# Patient Record
Sex: Female | Born: 1965
Health system: Southern US, Community
[De-identification: ages and names within clinical notes are randomized; demographics above are authoritative.]

## PROBLEM LIST (undated history)

## (undated) ENCOUNTER — Ambulatory Visit (HOSPITAL_COMMUNITY): Admission: EM | Disposition: A | Discharge status: Other Acute Inpatient Hospital | Payer: 59 | Source: Ambulatory Visit

## (undated) DIAGNOSIS — I499 Cardiac arrhythmia, unspecified: Secondary | ICD-10-CM

## (undated) DIAGNOSIS — L02429 Furuncle of limb, unspecified: Secondary | ICD-10-CM

## (undated) DIAGNOSIS — M797 Fibromyalgia: Secondary | ICD-10-CM

## (undated) DIAGNOSIS — M1990 Unspecified osteoarthritis, unspecified site: Secondary | ICD-10-CM

## (undated) DIAGNOSIS — K648 Other hemorrhoids: Secondary | ICD-10-CM

## (undated) DIAGNOSIS — J45909 Unspecified asthma, uncomplicated: Secondary | ICD-10-CM

## (undated) DIAGNOSIS — W5501XA Bitten by cat, initial encounter: Secondary | ICD-10-CM

## (undated) DIAGNOSIS — D649 Anemia, unspecified: Secondary | ICD-10-CM

## (undated) DIAGNOSIS — D126 Benign neoplasm of colon, unspecified: Secondary | ICD-10-CM

## (undated) DIAGNOSIS — T7840XA Allergy, unspecified, initial encounter: Secondary | ICD-10-CM

## (undated) DIAGNOSIS — R51 Headache: Secondary | ICD-10-CM

## (undated) DIAGNOSIS — K469 Unspecified abdominal hernia without obstruction or gangrene: Secondary | ICD-10-CM

## (undated) DIAGNOSIS — IMO0002 Reserved for concepts with insufficient information to code with codable children: Secondary | ICD-10-CM

## (undated) DIAGNOSIS — C4491 Basal cell carcinoma of skin, unspecified: Secondary | ICD-10-CM

## (undated) DIAGNOSIS — I10 Essential (primary) hypertension: Secondary | ICD-10-CM

## (undated) HISTORY — DX: Other hemorrhoids: K64.8

## (undated) HISTORY — DX: Essential (primary) hypertension: I10

## (undated) HISTORY — DX: Bitten by cat, initial encounter: W55.01XA

## (undated) HISTORY — DX: Basal cell carcinoma of skin, unspecified: C44.91

## (undated) HISTORY — PX: TONSILLECTOMY: SUR1361

## (undated) HISTORY — DX: Benign neoplasm of colon, unspecified: D12.6

## (undated) HISTORY — PX: PILONIDAL CYST / SINUS EXCISION: SUR543

## (undated) HISTORY — DX: Allergy, unspecified, initial encounter: T78.40XA

## (undated) HISTORY — PX: HAND SURGERY: SHX662

## (undated) HISTORY — DX: Unspecified osteoarthritis, unspecified site: M19.90

## (undated) HISTORY — DX: Unspecified abdominal hernia without obstruction or gangrene: K46.9

## (undated) HISTORY — PX: WISDOM TOOTH EXTRACTION: SHX21

## (undated) HISTORY — DX: Reserved for concepts with insufficient information to code with codable children: IMO0002

## (undated) HISTORY — PX: BUNIONECTOMY: SHX129

## (undated) HISTORY — DX: Unspecified asthma, uncomplicated: J45.909

---

## 2004-03-20 ENCOUNTER — Other Ambulatory Visit: Admission: RE | Admit: 2004-03-20 | Discharge: 2004-03-20 | Payer: Self-pay | Admitting: Obstetrics and Gynecology

## 2005-03-21 ENCOUNTER — Other Ambulatory Visit: Admission: RE | Admit: 2005-03-21 | Discharge: 2005-03-21 | Payer: Self-pay | Admitting: Obstetrics and Gynecology

## 2005-09-17 ENCOUNTER — Ambulatory Visit: Payer: Self-pay | Admitting: Family Medicine

## 2005-09-22 ENCOUNTER — Ambulatory Visit: Payer: Self-pay | Admitting: Family Medicine

## 2005-09-23 ENCOUNTER — Ambulatory Visit: Payer: Self-pay | Admitting: Family Medicine

## 2005-09-25 ENCOUNTER — Encounter: Admission: RE | Admit: 2005-09-25 | Discharge: 2005-09-25 | Payer: Self-pay | Admitting: Family Medicine

## 2005-09-29 ENCOUNTER — Ambulatory Visit: Payer: Self-pay | Admitting: Family Medicine

## 2006-03-26 ENCOUNTER — Ambulatory Visit: Payer: Self-pay | Admitting: Gastroenterology

## 2006-03-27 ENCOUNTER — Other Ambulatory Visit: Admission: RE | Admit: 2006-03-27 | Discharge: 2006-03-27 | Payer: Self-pay | Admitting: Obstetrics and Gynecology

## 2006-04-10 ENCOUNTER — Encounter: Payer: Self-pay | Admitting: Family Medicine

## 2006-04-10 ENCOUNTER — Ambulatory Visit: Payer: Self-pay | Admitting: Gastroenterology

## 2006-06-25 ENCOUNTER — Ambulatory Visit: Payer: Self-pay | Admitting: Family Medicine

## 2006-09-24 ENCOUNTER — Ambulatory Visit: Payer: Self-pay | Admitting: Family Medicine

## 2006-09-28 ENCOUNTER — Encounter: Admission: RE | Admit: 2006-09-28 | Discharge: 2006-09-28 | Payer: Self-pay | Admitting: Obstetrics and Gynecology

## 2007-02-16 DIAGNOSIS — L02519 Cutaneous abscess of unspecified hand: Secondary | ICD-10-CM | POA: Insufficient documentation

## 2007-02-16 DIAGNOSIS — L03119 Cellulitis of unspecified part of limb: Secondary | ICD-10-CM

## 2007-02-17 ENCOUNTER — Ambulatory Visit (HOSPITAL_COMMUNITY): Admission: RE | Admit: 2007-02-17 | Discharge: 2007-02-20 | Payer: Self-pay | Admitting: Orthopedic Surgery

## 2007-02-17 ENCOUNTER — Ambulatory Visit: Payer: Self-pay | Admitting: Family Medicine

## 2007-02-19 ENCOUNTER — Ambulatory Visit: Payer: Self-pay | Admitting: Internal Medicine

## 2007-02-22 ENCOUNTER — Encounter (HOSPITAL_COMMUNITY): Admission: RE | Admit: 2007-02-22 | Discharge: 2007-05-14 | Payer: Self-pay | Admitting: Internal Medicine

## 2007-03-04 DIAGNOSIS — H8110 Benign paroxysmal vertigo, unspecified ear: Secondary | ICD-10-CM | POA: Insufficient documentation

## 2007-03-11 ENCOUNTER — Ambulatory Visit: Payer: Self-pay | Admitting: Family Medicine

## 2007-03-11 ENCOUNTER — Telehealth (INDEPENDENT_AMBULATORY_CARE_PROVIDER_SITE_OTHER): Payer: Self-pay | Admitting: *Deleted

## 2007-03-16 ENCOUNTER — Ambulatory Visit (HOSPITAL_BASED_OUTPATIENT_CLINIC_OR_DEPARTMENT_OTHER): Admission: RE | Admit: 2007-03-16 | Discharge: 2007-03-16 | Payer: Self-pay | Admitting: Orthopedic Surgery

## 2007-03-29 ENCOUNTER — Other Ambulatory Visit: Admission: RE | Admit: 2007-03-29 | Discharge: 2007-03-29 | Payer: Self-pay | Admitting: Obstetrics and Gynecology

## 2007-05-04 ENCOUNTER — Telehealth (INDEPENDENT_AMBULATORY_CARE_PROVIDER_SITE_OTHER): Payer: Self-pay | Admitting: *Deleted

## 2007-05-24 ENCOUNTER — Ambulatory Visit: Payer: Self-pay | Admitting: Family Medicine

## 2007-05-24 DIAGNOSIS — R002 Palpitations: Secondary | ICD-10-CM | POA: Insufficient documentation

## 2007-05-24 DIAGNOSIS — IMO0002 Reserved for concepts with insufficient information to code with codable children: Secondary | ICD-10-CM | POA: Insufficient documentation

## 2007-05-27 ENCOUNTER — Ambulatory Visit: Payer: Self-pay | Admitting: Family Medicine

## 2007-05-27 ENCOUNTER — Encounter: Admission: RE | Admit: 2007-05-27 | Discharge: 2007-05-27 | Payer: Self-pay | Admitting: Family Medicine

## 2007-05-27 DIAGNOSIS — I10 Essential (primary) hypertension: Secondary | ICD-10-CM | POA: Insufficient documentation

## 2007-06-02 ENCOUNTER — Telehealth: Payer: Self-pay | Admitting: Internal Medicine

## 2007-06-29 ENCOUNTER — Ambulatory Visit: Payer: Self-pay | Admitting: Family Medicine

## 2007-09-29 ENCOUNTER — Encounter: Admission: RE | Admit: 2007-09-29 | Discharge: 2007-09-29 | Payer: Self-pay | Admitting: Family Medicine

## 2008-03-29 ENCOUNTER — Encounter: Payer: Self-pay | Admitting: Obstetrics and Gynecology

## 2008-03-29 ENCOUNTER — Ambulatory Visit: Payer: Self-pay | Admitting: Obstetrics and Gynecology

## 2008-03-29 ENCOUNTER — Other Ambulatory Visit: Admission: RE | Admit: 2008-03-29 | Discharge: 2008-03-29 | Payer: Self-pay | Admitting: Obstetrics and Gynecology

## 2008-04-11 ENCOUNTER — Ambulatory Visit: Payer: Self-pay | Admitting: Obstetrics and Gynecology

## 2008-04-14 ENCOUNTER — Ambulatory Visit: Payer: Self-pay | Admitting: Oncology

## 2008-05-30 ENCOUNTER — Ambulatory Visit: Payer: Self-pay | Admitting: Oncology

## 2008-07-26 ENCOUNTER — Ambulatory Visit: Payer: Self-pay | Admitting: Oncology

## 2008-08-25 ENCOUNTER — Ambulatory Visit: Payer: Self-pay | Admitting: Family Medicine

## 2008-10-17 ENCOUNTER — Encounter: Admission: RE | Admit: 2008-10-17 | Discharge: 2008-10-17 | Payer: Self-pay | Admitting: Obstetrics and Gynecology

## 2009-03-30 ENCOUNTER — Ambulatory Visit: Payer: Self-pay | Admitting: Obstetrics and Gynecology

## 2009-03-30 ENCOUNTER — Other Ambulatory Visit: Admission: RE | Admit: 2009-03-30 | Discharge: 2009-03-30 | Payer: Self-pay | Admitting: Obstetrics and Gynecology

## 2009-05-03 ENCOUNTER — Ambulatory Visit: Payer: Self-pay | Admitting: Obstetrics and Gynecology

## 2009-05-31 ENCOUNTER — Ambulatory Visit: Payer: Self-pay | Admitting: Obstetrics and Gynecology

## 2009-10-19 ENCOUNTER — Ambulatory Visit: Payer: Self-pay | Admitting: Family Medicine

## 2009-10-19 LAB — CONVERTED CEMR LAB
ALT: 18 units/L (ref 0–35)
Alkaline Phosphatase: 110 units/L (ref 39–117)
BUN: 22 mg/dL (ref 6–23)
Basophils Relative: 0.5 % (ref 0.0–3.0)
CO2: 33 meq/L — ABNORMAL HIGH (ref 19–32)
Chloride: 101 meq/L (ref 96–112)
Eosinophils Absolute: 0.4 10*3/uL (ref 0.0–0.7)
HCT: 39.2 % (ref 36.0–46.0)
HDL: 52.3 mg/dL (ref >39.00)
Leukocytes, UA: NEGATIVE
Lymphocytes Relative: 27.6 % (ref 12.0–46.0)
Lymphs Abs: 3.2 10*3/uL (ref 0.7–4.0)
MCHC: 33.4 g/dL (ref 30.0–36.0)
Monocytes Relative: 6.8 % (ref 3.0–12.0)
Nitrite: NEGATIVE
Platelets: 383 10*3/uL (ref 150.0–400.0)
Potassium: 4.6 meq/L (ref 3.5–5.1)
RBC: 4.88 M/uL (ref 3.87–5.11)
RDW: 14.5 % (ref 11.5–14.6)
Sodium: 142 meq/L (ref 135–145)
TSH: 1.35 microintl units/mL (ref 0.35–5.50)
Total Bilirubin: 0.5 mg/dL (ref 0.3–1.2)
Total CHOL/HDL Ratio: 4
Triglycerides: 118 mg/dL (ref 0.0–149.0)
VLDL: 23.6 mg/dL (ref 0.0–40.0)
pH: 8 (ref 5.0–8.0)

## 2009-10-22 ENCOUNTER — Encounter: Admission: RE | Admit: 2009-10-22 | Discharge: 2009-10-22 | Payer: Self-pay | Admitting: Obstetrics and Gynecology

## 2009-11-23 ENCOUNTER — Ambulatory Visit: Payer: Self-pay | Admitting: Family Medicine

## 2009-11-23 DIAGNOSIS — E669 Obesity, unspecified: Secondary | ICD-10-CM | POA: Insufficient documentation

## 2010-04-01 ENCOUNTER — Other Ambulatory Visit: Admission: RE | Admit: 2010-04-01 | Discharge: 2010-04-01 | Payer: Self-pay | Admitting: Obstetrics and Gynecology

## 2010-04-01 ENCOUNTER — Ambulatory Visit: Payer: Self-pay | Admitting: Obstetrics and Gynecology

## 2010-06-09 LAB — CONVERTED CEMR LAB
ALT: 13 units/L (ref 0–35)
AST: 19 units/L (ref 0–37)
Albumin: 3.7 g/dL (ref 3.5–5.2)
Basophils Absolute: 0.1 10*3/uL (ref 0.0–0.1)
CO2: 25 meq/L (ref 19–32)
Eosinophils Absolute: 0.3 10*3/uL (ref 0.0–0.6)
GFR calc Af Amer: 88 mL/min
Ketones, urine, test strip: NEGATIVE
MCHC: 33.6 g/dL (ref 30.0–36.0)
MCV: 74.5 fL — ABNORMAL LOW (ref 78.0–100.0)
Monocytes Absolute: 0.5 10*3/uL (ref 0.2–0.7)
Monocytes Relative: 3.7 % (ref 3.0–11.0)
Neutro Abs: 9 10*3/uL — ABNORMAL HIGH (ref 1.4–7.7)
Neutrophils Relative %: 68.3 % (ref 43.0–77.0)
Nitrite: NEGATIVE
Platelets: 453 10*3/uL — ABNORMAL HIGH (ref 150–400)
Protein, U semiquant: NEGATIVE
RBC: 4.74 M/uL (ref 3.87–5.11)
RDW: 14.8 % — ABNORMAL HIGH (ref 11.5–14.6)
Specific Gravity, Urine: 1.02
TSH: 0.88 microintl units/mL (ref 0.35–5.50)
Total Bilirubin: 0.4 mg/dL (ref 0.3–1.2)
Total Protein: 6.7 g/dL (ref 6.0–8.3)
WBC Urine, dipstick: NEGATIVE
WBC: 13.2 10*3/uL — ABNORMAL HIGH (ref 4.5–10.5)
pH: 5.5

## 2010-06-11 NOTE — Procedures (Signed)
Summary: Colonoscopy/[facility]  Colonoscopy/[facility]   Imported By: Sherian Rein 10/29/2009 15:14:57  _____________________________________________________________________  External Attachment:    Type:   Image     Comment:   External Document

## 2010-06-11 NOTE — Assessment & Plan Note (Signed)
Summary: cpx/cjr/PT RESCD//CCM   Vital Signs:  Patient profile:   45 year old female Height:      68.25 inches Weight:      239 pounds BMI:     36.20 Temp:     98.7 degrees F oral BP sitting:   134 / 94  (left arm) Cuff size:   regular  Vitals Entered By: Kern Reap CMA Duncan Dull) (November 23, 2009 4:04 PM) CC: cpx Is Patient Diabetic? No   CC:  cpx.  History of Present Illness: April [name] is a 45 year old female, married, nonsmoker, with underlying hypertension, and obesity.  Has gained 18 pounds in the last 12 months who comes in today for physical examination  BP at home 134/94.  Same here asked her to check her BP daily for 3 weeks.  If diastolic continues to stay above 85 to contact us and we will change her medication.  Her weight is up to 239...Marland KitchenMarland KitchenMarland Kitchen recommend nutrition consult.  She also takes Claritin 10 mg daily for allergic rhinitis.  By GYN.  She Does Get Annual Mammography Is Not Due BSE Monthly.  She Continues to Have Vertigo.  She's Been to See Dr. Narda Bonds.  ENT and Has Had the Epley Maneuver, However, She Still Has Intermittent Daily, Vertigo.  Advised She Call Dr. Ezzard Standing and to Consult at [facility]  She recently had a total body skin exam by her dermatologist.  She has slight skin line.  Eyes get a lot of sun exposure and also used to go to tanning booth  Allergies: 1)  ! Amoxicillin 2)  ! Pcn  Past History:  Past medical, surgical, family and social histories (including risk factors) reviewed, and no changes noted (except as noted below).  Past Medical History: Reviewed history from 05/27/2007 and no changes required. cat bite with secondary infection right hand hospitalized October 2008 for surgery Hypertension  Family History: Reviewed history and no changes required.  Social History: Reviewed history and no changes required.  Review of Systems      See HPI  Physical Exam  General:  Well-developed,well-nourished,in no acute distress;  alert,appropriate and cooperative throughout examination Head:  Normocephalic and atraumatic without obvious abnormalities. No apparent alopecia or balding. Eyes:  No corneal or conjunctival inflammation noted. EOMI. Perrla. Funduscopic exam benign, without hemorrhages, exudates or papilledema. Vision grossly normal. Ears:  External ear exam shows no significant lesions or deformities.  Otoscopic examination reveals clear canals, tympanic membranes are intact bilaterally without bulging, retraction, inflammation or discharge. Hearing is grossly normal bilaterally. Nose:  External nasal examination shows no deformity or inflammation. Nasal mucosa are pink and moist without lesions or exudates. Mouth:  Oral mucosa and oropharynx without lesions or exudates.  Teeth in good repair. Neck:  No deformities, masses, or tenderness noted. Chest Wall:  No deformities, masses, or tenderness noted. Breasts:  No mass, nodules, thickening, tenderness, bulging, retraction, inflamation, nipple discharge or skin changes noted.   Lungs:  Normal respiratory effort, chest expands symmetrically. Lungs are clear to auscultation, no crackles or wheezes. Heart:  Normal rate and regular rhythm. S1 and S2 normal without gallop, murmur, click, rub or other extra sounds. Abdomen:  Bowel sounds positive,abdomen soft and non-tender without masses, organomegaly or hernias noted. Msk:  No deformity or scoliosis noted of thoracic or lumbar spine.   Pulses:  R and L carotid,radial,femoral,dorsalis pedis and posterior tibial pulses are full and equal bilaterally Extremities:  No clubbing, cyanosis, edema, or deformity noted with normal full range of motion  of all joints.   Neurologic:  No cranial nerve deficits noted. Station and gait are normal. Plantar reflexes are down-going bilaterally. DTRs are symmetrical throughout. Sensory, motor and coordinative functions appear intact. Skin:  Intact without suspicious lesions or  rashes Cervical Nodes:  No lymphadenopathy noted Axillary Nodes:  No palpable lymphadenopathy Inguinal Nodes:  No significant adenopathy Psych:  Cognition and judgment appear intact. Alert and cooperative with normal attention span and concentration. No apparent delusions, illusions, hallucinations   Problems:  Medical Problems Added: 1)  Dx of Obesity  (ICD-278.00)  Impression & Recommendations:  Problem # 1:  HYPERTENSION (ICD-401.9) Assessment Improved  Her updated medication list for this problem includes:    Tenoretic 50 50-25 Mg Tabs (Atenolol-chlorthalidone) .Marland Kitchen... Take 1 tablet by mouth every morning  Orders: Prescription Created Electronically [phone]) EKG w/ Interpretation (93000)  Problem # 2:  VERTIGO, BENIGN PAROXYSMAL POSITION (ICD-386.11) Assessment: Unchanged  Her updated medication list for this problem includes:    Claritin 10 Mg Tabs (Loratadine) ..... Otc  Orders: Prescription Created Electronically [phone])  Problem # 3:  OBESITY (ICD-278.00)  Orders: Nutrition Referral (Nutrition) Prescription Created Electronically [phone])  Complete Medication List: 1)  Claritin 10 Mg Tabs (Loratadine) .... Otc 2)  Tenoretic 50 50-25 Mg Tabs (Atenolol-chlorthalidone) .... Take 1 tablet by mouth every morning  Patient Instructions: 1)  continue current blood pressure medication check a daily blood pressure in the morning for 3 weeks.  If your diastolic is averaging above 85.  Call. 2)  Let's consider a nutrition consult for weight loss 3)  Schedule your mammogram. 4)  Take an Aspirin every day. 5)  Please schedule a follow-up appointment in 1 year. Prescriptions: TENORETIC 50 50-25 MG  TABS (ATENOLOL-CHLORTHALIDONE) Take 1 tablet by mouth every morning  #100 x 3   Entered and Authorized by:   Roderick Pee MD   Signed by:   Roderick Pee MD on 11/23/2009   Method used:   Electronically to        [facility]  [phone]* (retail)       [address]  [place], [place]  [address]       Ph: [phone] or [phone]       Fax: [phone]   RxID:   [id number]    Immunization History:  Tetanus/Td Immunization History:    Tetanus/Td:  historical (02/10/2007)  Influenza Immunization History:    Influenza:  historical (02/09/2009)

## 2010-08-06 ENCOUNTER — Ambulatory Visit (INDEPENDENT_AMBULATORY_CARE_PROVIDER_SITE_OTHER): Payer: 59 | Admitting: Family Medicine

## 2010-08-06 ENCOUNTER — Encounter: Payer: Self-pay | Admitting: Family Medicine

## 2010-08-06 DIAGNOSIS — I10 Essential (primary) hypertension: Secondary | ICD-10-CM

## 2010-08-06 DIAGNOSIS — E669 Obesity, unspecified: Secondary | ICD-10-CM

## 2010-08-06 DIAGNOSIS — E785 Hyperlipidemia, unspecified: Secondary | ICD-10-CM

## 2010-08-06 LAB — HEPATIC FUNCTION PANEL
ALT: 17 U/L (ref 0–35)
AST: 21 U/L (ref 0–37)
Bilirubin, Direct: 0.1 mg/dL (ref 0.0–0.3)
Total Bilirubin: 0.5 mg/dL (ref 0.3–1.2)

## 2010-08-06 LAB — CBC WITH DIFFERENTIAL/PLATELET
Basophils Absolute: 0 10*3/uL (ref 0.0–0.1)
Basophils Relative: 0.3 % (ref 0.0–3.0)
Eosinophils Absolute: 0.3 10*3/uL (ref 0.0–0.7)
HCT: 37.3 % (ref 36.0–46.0)
Hemoglobin: 12.6 g/dL (ref 12.0–15.0)
Lymphocytes Relative: 26 % (ref 12.0–46.0)
Lymphs Abs: 3 10*3/uL (ref 0.7–4.0)
MCHC: 33.6 g/dL (ref 30.0–36.0)
MCV: 76.5 fl — ABNORMAL LOW (ref 78.0–100.0)
Monocytes Absolute: 1 10*3/uL (ref 0.1–1.0)
Neutro Abs: 7.4 10*3/uL (ref 1.4–7.7)
Neutrophils Relative %: 63.4 % (ref 43.0–77.0)
RDW: 15.1 % — ABNORMAL HIGH (ref 11.5–14.6)

## 2010-08-06 LAB — BASIC METABOLIC PANEL: Calcium: 9.7 mg/dL (ref 8.4–10.5)

## 2010-08-06 LAB — LDL CHOLESTEROL, DIRECT: Direct LDL: 174.6 mg/dL

## 2010-08-06 LAB — POCT URINALYSIS DIPSTICK
Glucose, UA: NEGATIVE
Nitrite, UA: NEGATIVE
Protein, UA: NEGATIVE

## 2010-08-06 LAB — LIPID PANEL
HDL: 54.8 mg/dL (ref >39.00)
Total CHOL/HDL Ratio: 5
Triglycerides: 86 mg/dL (ref 0.0–149.0)
VLDL: 17.2 mg/dL (ref 0.0–40.0)

## 2010-08-06 LAB — HEMOGLOBIN A1C: Hgb A1c MFr Bld: 6.1 % (ref 4.6–6.5)

## 2010-08-06 MED ORDER — ATENOLOL-CHLORTHALIDONE 50-25 MG PO TABS: 1.0000 | ORAL_TABLET | Freq: Every day | ORAL | Status: DC

## 2010-08-06 NOTE — Progress Notes (Signed)
  Subjective:    Patient ID: April [name], female    DOB: 26-Jul-1965, 45 y.o.   MRN: [id number]  HPI April [name] is a 46 year old female, nonsmoker, who comes in today for general physical examination because of a history of hypertension.  She takes Tenoretic 50 -- 25 daily.  BP 120/84.  She brings in blood pressure readings for last Friday when she had an argument at work.  Her blood pressure one 270 over hundred.  The next day went back to normal.  She gets routine eye care, dental care, BSE monthly, annual mammography, Pap by GYN, vaccinations up-to-date.     Review of Systems  Constitutional: Negative.   HENT: Negative.   Eyes: Negative.   Respiratory: Negative.   Cardiovascular: Negative.   Gastrointestinal: Negative.   Genitourinary: Negative.   Musculoskeletal: Negative.   Neurological: Negative.   Hematological: Negative.   Psychiatric/Behavioral: Negative.        Objective:   Physical Exam  Constitutional: She appears well-developed and well-nourished.  HENT:  Head: Normocephalic and atraumatic.  Right Ear: External ear normal.  Left Ear: External ear normal.  Nose: Nose normal.  Mouth/Throat: Oropharynx is clear and moist.  Eyes: EOM are normal. Pupils are equal, round, and reactive to light.  Neck: Normal range of motion. Neck supple. No thyromegaly present.  Cardiovascular: Normal rate, regular rhythm, normal heart sounds and intact distal pulses.  Exam reveals no gallop and no friction rub.   No murmur heard. Pulmonary/Chest: Effort normal and breath sounds normal.  Abdominal: Soft. Bowel sounds are normal. She exhibits no distension and no mass. There is no tenderness. There is no rebound.  Genitourinary:       Bilateral breast exam normal  Musculoskeletal: Normal range of motion.  Lymphadenopathy:    She has no cervical adenopathy.  Neurological: She is alert. She has normal reflexes. No cranial nerve deficit. She exhibits normal muscle tone. Coordination  normal.  Skin: Skin is warm and dry.       Total body skin exam normal.  She has a garden variety of freckles and moles and skin tags.  She does have a small 1 mm mole on her right labia.  Also 3 venous lake type lesions on her left labia also a venous lake.  Lesion on her left fourth toe.  Psychiatric: She has a normal mood and affect. Her behavior is normal. Judgment and thought content normal.          Assessment & Plan:  Hypertension,,,,,,,,,, continue current medications.  Overweight,,,,,,,,, diet exercise and weight loss

## 2010-08-06 NOTE — Patient Instructions (Signed)
Continue your current medications.  If you have a situation where your blood pressure is very high and take a half a Tenormin and lie down for a couple hours.  Begin a walking program 30 minutes daily.  Return in one year or sooner if any problems

## 2010-09-24 NOTE — Op Note (Signed)
NAMEJODIANN, April [name]                  ACCOUNT NO.:  [id number]   MEDICAL RECORD NO.:  [id number]          PATIENT TYPE:  OIB   LOCATION:  2550                         FACILITY:  [facility]   PHYSICIAN:  Katy Fitch. Sypher, M.D. DATE OF BIRTH:  [birth year]   DATE OF PROCEDURE:  02/17/2007  DATE OF DISCHARGE:                               OPERATIVE REPORT   PREOPERATIVE DIAGNOSES:  1. Probable right long finger metacarpophalangeal joint septic      arthritis and severe cellulitis with development of subcutaneous      abscess, dorsal aspect of right hand, following complex bite and      scratch injury to right hand sustained February 15, 2007.  2. Early septicemia signs.   POSTOPERATIVE DIAGNOSES:  1. Septic arthritis of right long finger metacarpophalangeal joint      treated with irrigation, debridement and synovectomy and placement      of Iodoform packing, right long finger metacarpophalangeal joint.  2. Complex subcutaneous abscess due to multiple cat bites and      scratches, dorsal aspect of right hand, with abscess extending from      metacarpophalangeal joints to wrist extension creases.  3. Rule out septic flexor tenosynovitis.   OPERATION:  1. Incision and drainage of multiple cat bite abscesses.  2. Incision and drainage of right long finger metacarpophalangeal      joint with sterile saline irrigation and placement of Iodoform      packing.  3. Incision and drainage of dorsal aspect of right hand with placement      of four vessel loop drain closed loops.  4. Incision and inspection of flexor sheath to rule out flexor sheath      sepsis and palmar/collar button abscess formation.   OPERATING SURGEON:  Katy Fitch. Sypher, M.D.  No assistant.   ANESTHESIA:  General endotracheal, supervising anesthesiologist is  Janetta Hora. Gelene Mink, M.D.   INDICATIONS:  April [name] is a 45 year old woman referred by Dr. Alonza Smoker for evaluation of a severe cat bite sepsis and cellulitis.   On  February 15, 2007, April [name] was interacting with feral cat, who  subsequently attacked her hand, biting and scratching her.  She  sustained full-thickness skin wounds over the dorsal aspect of her right  long finger metacarpophalangeal joint and multiple bites and scratches  over the dorsum of her hand and wrist.   She was seen at an urgent care facility, where her wound was cleaned and  SUTURED.  She subsequent was placed on azithromycin.   She became ill and subsequently presented for evaluation at Dr. Nelida Meuse  office on the morning of February 17, 2007.  At that time Dr. Tawanna Cooler noted  that she was febrile, sick-appearing, with a markedly swollen hand.   Dr. Tawanna Cooler contacted me at our office requesting an urgent hand surgery  consultation.   I emphatically informed Dr. Tawanna Cooler that in order to care for April [name],  she would need to be n.p.o., meaning no fluid or drink, from this moment  forward of our conversation.  She was advised to transport  herself to  our office on an urgent basis so that we may assess her wounds and make  arrangement for immediate outpatient surgery with probable admission for  IV antibiotics.   After emphatically stating these issues to Dr. Tawanna Cooler, we subsequently  waited for nearly 1-1/2 hours for April [name] to appear at our office.  April [name] she ultimately appeared, we questioned her whether not she had had  any food or drink.  She stated that she stopped at [employer] and had a  full breakfast including chicken nuggets and extensive liquids.   She stated that, No one told her not to eat.   We subsequently contacted the [facility] day surgery center in the hospital.  The day surgery center could not provide anesthesia services with a full  stomach; therefore, despite the fact we had reserved an operating room  for urgent care, we needed to transfer the venue for her care to [facility].   April [name] was assessed at the office and immediately transferred to the  short-stay  unit at [facility], where an IV was started for hydration  and her vital signs were examined.   She was noted to have a temperature at admission of 101.7, a pulse of  99, respiratory rate of 18 and blood pressure of 143/91.  Her laboratory  studies revealed a white count of 8400, a hemoglobin of 12 and a  platelet count 345,000.  Her electrolytes were normal.  Her glucose was  elevated 114, random.   We placed her on the OR schedule for urgent care.  We were advised that  we were fifth on the add-on list for urgent surgery.   During the ensuing 6 hours she was noted to have a temperature elevation  of as high as 102.6 and became increasingly toxic.   We continued to inquire at the OR and even began the process of  attempting to bump another surgeon; however, ultimately we were able to  access the operating room at approximately 8 p.m.   She is brought to the operating room at this time anticipating incision  and drainage.   Preoperatively, I had a lengthy informed consent with April [name] and her  mother.  All of the issues of her n.p.o. status, access to the OR, and  our need for urgent incision and drainage were explored detail.  Questions were invited and answered.   PROCEDURE:  April [name] was brought to the operating room and placed in  supine position upon the operating table.   Following an anesthesia consult by Dr. Gelene Mink, general anesthesia by  endotracheal technique was recommended.   She was brought to room #1, placed in supine position on the operating  table, and under Dr. Thornton Dales direct supervision, general  endotracheal anesthesia induced with rapid-sequence technique.   A tourniquet was applied to the proximal right brachium.  The right arm  was then prepped with Betadine soap and solution and sterilely draped.  After 2 minutes of elevation, the arterial tourniquet was inflated to  250 mmHg on the proximal brachium.   The procedure commenced with  excisional biopsy of the skin margins  around all of the traumatic bite wounds.  Granulation tissue was  forming.   The subcutaneous tissues were probed and dishwater-type seropurulent  material was recovered.  Aerobic and anaerobic cultures were obtained  from the subcutaneous abscess, followed by entry into the long finger  metacarpophalangeal joint by resecting the capsule between the extensor  tendon and  the ulnar collateral ligament.  Blunt hemostats were used to  probe the joint.  Seropurulent fluid was recovered from the joint, which  was again cultured for aerobic and anaerobic growth and samples of the  synovium were biopsied for aerobic and anaerobic growth.   The subcutaneous area was palpated and all undermined areas were opened.  A total of four incisions were made on the dorsum of the hand with  placement of looped vessel-loop sutures to promote drainage.   The wounds were subsequently carefully irrigated with a blunt dental  needle, including the radial and ulnar gutters of the  metacarpophalangeal joint.   Attention was then directed to the palm.   Due to the presence of a positive Kanavel pain with extension and pain  on palpation over the flexor sheath sign, a small incision was fashioned  in the distal palmar crease allowing exposure of the flexor sheath  proximal to the A1 pulley.   The synovium was incised and the finger milked.   A small amount of fluid was recovered.  However, no purulence was noted.  I could not identify any evidence of a collar button abscess from the  dorsum.   The dorsal wound was then packed with iodoform into the capsule of the  MP joint to promote egress of a purulent effusion.  The vessel loop  drains was secured and Adaptic applied to the wounds.  A voluminous  gauze dressing was applied with hand in a safe position.  Ace wrap was  used to provide compression.   There were no apparent complications.  The tourniquet released for  a  total tourniquet time of 26 minutes.  Ms. Alonge was awakened from  general anesthesia and transferred to the recovery room with stable  vital signs.   Preoperatively, due to our concern about sepsis she was provided 600 mg  of clindamycin IV and will continue on this medication q.6h. after an  infectious disease consult was obtained due to her drug allergies.  We  also will add Cipro 400 mg IV after the cultures were obtained and will  continue Cipro 500 mg p.o. b.i.d.   We will admit her for 24-48 hours of IV antibiotic therapy and once our  culture results are known, we will continue with oral and/or IV  antibiotic therapy at home.      Katy Fitch Sypher, M.D.  Electronically Signed     RVS/MEDQ  D:  02/17/2007  T:  02/18/2007  Job:  [id number]   cc:   Tinnie Gens A. Tawanna Cooler, MD

## 2010-09-24 NOTE — Op Note (Signed)
NAMEALYSEN, SMYLIE                  ACCOUNT NO.:  [id number]   MEDICAL RECORD NO.:  [id number]          PATIENT TYPE:  AMB   LOCATION:  DSC                          FACILITY:  [facility]   PHYSICIAN:  Katy Fitch. Sypher, M.D. DATE OF BIRTH:  1965/07/13   DATE OF PROCEDURE:  03/16/2007  DATE OF DISCHARGE:  03/16/2007                               OPERATIVE REPORT   PREOPERATIVE DIAGNOSIS:  Status post complex cat bite sepsis with septic  arthritis of the right long finger metacarpal phalangeal joint status  post incision and drainage, intravenous antibiotic therapy, and  prolonged oral antibiotic therapy with persistent granulation tissue at  wound, rule out residual sepsis, rule out significant capsulitis of the  long finger MP joint.   POSTOPERATIVE DIAGNOSIS:  Significant scarring of extensor mechanism,  right long finger metacarpal phalangeal joint, and identification of a  small area of granulation tissue on the dorsal neck of the long finger  metacarpal which could represent an area of a nidus of persistent  infection.   OPERATION:  1. Second look right long finger metacarpal phalangeal joint wound      with aerobic and anaerobic culture of granulation tissue and      peritendinous fibrosis.  2. Curettage of metacarpal neck.  3. Tenolysis of extensor mechanism with intrinsic muscle      stretch/manipulation and gentle manipulation metacarpal phalangeal      joint recovering 0 to 90 degrees of motion.   SURGEON:  Katy Fitch. Sypher, M.D.   ASSISTANT:  Marveen Reeks. Dasnoit, P.A.-C.   ANESTHESIA:  2% lidocaine field block and metacarpal phalangeal joint  block supplemented by IV sedation.   SUPERVISING ANESTHESIOLOGIST:  Guadalupe Maple, M.D.   INDICATIONS:  Island Dohmen is a 45 year old woman who is now in the fifth  week following a complex cat bite/septic arthritis/cellulitis wound  predicament involving the right hand.  Ms. Docter had been feeding feral  cats and became involved in  an altercation between one of the animals  sustaining deep bite wounds to the right hand.  She was initially  treated at an outpatient center with oral antibiotic therapy and wound  management.  She subsequently developed a profound cellulitis and septic  arthritis of the long finger metacarpal phalangeal joint requiring  admission to [facility] for incision and drainage of her right  long finger metacarpal phalangeal joint, drainage of the dorsal aspect  of her hand, and IV antibiotic therapy.  Due to an inability for animal  control to capture the animal, she is also undergoing rabies vaccine  prophylaxis.   We have allowed her metacarpal phalangeal joint and metacarpal wound to  remain open out of concern that she is unable to use penicillins and  cephalosporins which are the preferred antibiotics for a Pasteurella  infection.  While her cultures grew pure Pasteurella multacida, she has  been using second line drugs in the form of Cipro and clindamycin to  treat this predicament.  Her wound has been a bit slow in closing,  therefore, we recommend proceeding with a reculture at this  time, a  second look at her wound, and tenolysis of her extensor mechanism in an  effort to facilitate wound closure and recovery of range of motion and  to assure that her infection has resolved.  After informed consent, she  is brought to the operating room at this time.   PROCEDURE:  Lisett Dirusso is brought to the operating room and placed in  the supine position on the operating table.  Following IV sedation, 2%  lidocaine was infiltrated at the level of the metacarpal diaphysis and  surrounding metacarpal phalangeal joint to obtain a field block.  The  right arm was then prepped with Betadine soap solution and sterilely  draped.  A pneumatic tourniquet was applied to the proximal right  brachium.  Following exsanguination of the right arm with an Esmarch  bandage, the arterial tourniquet was  inflated to 250 mmHg.  The  procedure commenced with examination of her anesthesia level.  She  appeared to have excellent anesthesia around the MP joint.   An excisional debridement of the wound was accomplished with excision of  all of the prior surgical scar.  The skin was apportioned, half to  aerobic and half to anaerobic culture, followed by use of a micro-  rongeur and curette to remove granulation tissue beneath the surface of  the wound.  These specimens were placed in aerobic and anaerobic tubes  of culture media and sent to the lab for routine aerobic and anaerobic  culture with sensitivities.   The extensor mechanism was then meticulously tenolysed with the aid of a  rongeur to remove granulation tissue, a Therapist, nutritional, and scissors  dissection.  The MP joint was entered.  I noted an erosion on the dorsal  aspect of the metacarpal neck that had some granulation tissue present.  This was thoroughly curetted to a stable bone margin with a  microcurette.  This is of some concern and may represent an area of  persistent granulation tissue/infection.  Cultures will be revealing  whether or not an infection persists.   The wound was thoroughly irrigated followed by gentle tenolysis of the  capsule and manipulation of the MP joint to hyperextension 5 degrees and  further flexion 90 degrees.  Intrinsic stretching noted that with full  MP extension, 95 degrees of MP flexion was allowed.  Thereafter, the  wound was irrigated followed by placement of an intradermal 3-0 Prolene  suture that was not formally tied but rather held in place by Steri-  Strips to allow a delayed primary closure at a later date once her  culture results have been received.   Ms. Oconnor will start her Cipro 500 mg p.o. b.i.d. immediately.  She will  return to the office for follow up in 24 hours for dressing change as it  is likely she will have some drainage from this wound.  There were no  apparent  complications.  Ms. Kimery tolerated the surgery and anesthesia  well.  She was awakened from sedation, the drapes were removed, and she  was transferred back to the recovery room with stable vital signs.      Katy Fitch Sypher, M.D.  Electronically Signed     RVS/MEDQ  D:  03/16/2007  T:  03/16/2007  Job:  [id number]

## 2010-12-18 ENCOUNTER — Other Ambulatory Visit: Payer: Self-pay | Admitting: Obstetrics and Gynecology

## 2010-12-18 DIAGNOSIS — Z1231 Encounter for screening mammogram for malignant neoplasm of breast: Secondary | ICD-10-CM

## 2010-12-23 ENCOUNTER — Ambulatory Visit
Admission: RE | Admit: 2010-12-23 | Discharge: 2010-12-23 | Disposition: A | Discharge status: Home / Self Care | Payer: 59 | Source: Ambulatory Visit | Attending: Obstetrics and Gynecology | Admitting: Obstetrics and Gynecology

## 2010-12-23 DIAGNOSIS — Z1231 Encounter for screening mammogram for malignant neoplasm of breast: Secondary | ICD-10-CM

## 2011-02-18 LAB — ANAEROBIC CULTURE

## 2011-02-18 LAB — TISSUE CULTURE: Culture: NO GROWTH

## 2011-02-20 LAB — ANAEROBIC CULTURE

## 2011-02-20 LAB — DIFFERENTIAL
Basophils Absolute: 0
Basophils Relative: 1
Lymphocytes Relative: 26
Monocytes Absolute: 0.9 — ABNORMAL HIGH
Neutro Abs: 5.7
Neutrophils Relative %: 63

## 2011-02-20 LAB — WOUND CULTURE

## 2011-02-20 LAB — COMPREHENSIVE METABOLIC PANEL
ALT: 12
Albumin: 3.4 — ABNORMAL LOW
BUN: 8
CO2: 23
Calcium: 8.9
Creatinine, Ser: 0.81
GFR calc non Af Amer: >60
Glucose, Bld: 114 — ABNORMAL HIGH
Potassium: 3.8
Total Protein: 6.5

## 2011-02-20 LAB — CBC
HCT: 36
Hemoglobin: 10.7 — ABNORMAL LOW
MCHC: 33.5
Platelets: 308
Platelets: 345
RDW: 14.8 — ABNORMAL HIGH

## 2011-04-09 ENCOUNTER — Encounter: Payer: Self-pay | Admitting: *Deleted

## 2011-04-09 DIAGNOSIS — W5501XA Bitten by cat, initial encounter: Secondary | ICD-10-CM | POA: Insufficient documentation

## 2011-04-09 DIAGNOSIS — I10 Essential (primary) hypertension: Secondary | ICD-10-CM | POA: Insufficient documentation

## 2011-04-16 ENCOUNTER — Encounter: Payer: Self-pay | Admitting: Obstetrics and Gynecology

## 2011-04-16 ENCOUNTER — Ambulatory Visit (INDEPENDENT_AMBULATORY_CARE_PROVIDER_SITE_OTHER): Payer: 59 | Admitting: Obstetrics and Gynecology

## 2011-04-16 VITALS — BP 124/80 | Ht 68.5 in | Wt 246.0 lb

## 2011-04-16 DIAGNOSIS — Z01419 Encounter for gynecological examination (general) (routine) without abnormal findings: Secondary | ICD-10-CM | POA: Insufficient documentation

## 2011-04-16 DIAGNOSIS — IMO0002 Reserved for concepts with insufficient information to code with codable children: Secondary | ICD-10-CM

## 2011-04-16 DIAGNOSIS — N925 Other specified irregular menstruation: Secondary | ICD-10-CM

## 2011-04-16 DIAGNOSIS — N938 Other specified abnormal uterine and vaginal bleeding: Secondary | ICD-10-CM

## 2011-04-16 DIAGNOSIS — R102 Pelvic and perineal pain: Secondary | ICD-10-CM

## 2011-04-16 DIAGNOSIS — N949 Unspecified condition associated with female genital organs and menstrual cycle: Secondary | ICD-10-CM

## 2011-04-16 NOTE — Progress Notes (Signed)
Patient came to see me today for her annual GYN exam. We have placed a Mirena IUD in December 2010. Prior to placing that patient had symptoms suggestive of endometriosis. After placing IUD patient's periods became better although did not disappear. Approximately 8 months ago patient started to have heavier periods, more frequent periods, and dyspareunia on deep penetration. Patient is up-to-date on mammograms. Patient does blood work from her PCP.  Physical examination:  April [name] present. HEENT within normal limits. Neck: Thyroid not large. No masses. Supraclavicular nodes: not enlarged. Breasts: Examined in both sitting midline position. No skin changes and no masses. Abdomen: Soft no guarding rebound or masses or hernia. Pelvic: External: Within normal limits. BUS: Within normal limits. Vaginal:within normal limits. Good estrogen effect. No evidence of cystocele rectocele or enterocele. Cervix: clean. No IUD string visible. Uterus: Normal size and shape. Adnexa: No masses. Rectovaginal exam: Confirmatory and negative. Extremities: Within normal limits.  Assessment: #1. DUB #2. Dyspareunia #3. Family history of endometriosis #4. Possibly misplaced IUD.  Plan: Pelvic ultrasound. ACOG booklet on laparoscopy. Future plan will be formulated on day of ultrasound. Patient to use condoms in the meantime.

## 2011-04-16 NOTE — Patient Instructions (Addendum)
Schedule ultrasound

## 2011-04-17 ENCOUNTER — Other Ambulatory Visit (HOSPITAL_COMMUNITY)
Admission: RE | Admit: 2011-04-17 | Discharge: 2011-04-17 | Disposition: A | Discharge status: Home / Self Care | Payer: 59 | Source: Ambulatory Visit | Attending: Obstetrics and Gynecology | Admitting: Obstetrics and Gynecology

## 2011-04-29 ENCOUNTER — Encounter: Payer: Self-pay | Admitting: Gastroenterology

## 2011-05-02 ENCOUNTER — Ambulatory Visit (INDEPENDENT_AMBULATORY_CARE_PROVIDER_SITE_OTHER): Payer: 59 | Admitting: Obstetrics and Gynecology

## 2011-05-02 ENCOUNTER — Ambulatory Visit (INDEPENDENT_AMBULATORY_CARE_PROVIDER_SITE_OTHER): Payer: 59

## 2011-05-02 DIAGNOSIS — J069 Acute upper respiratory infection, unspecified: Secondary | ICD-10-CM

## 2011-05-02 DIAGNOSIS — R102 Pelvic and perineal pain: Secondary | ICD-10-CM

## 2011-05-02 DIAGNOSIS — N949 Unspecified condition associated with female genital organs and menstrual cycle: Secondary | ICD-10-CM

## 2011-05-02 DIAGNOSIS — N920 Excessive and frequent menstruation with regular cycle: Secondary | ICD-10-CM

## 2011-05-02 MED ORDER — AZITHROMYCIN 250 MG PO TABS: ORAL_TABLET | ORAL | Status: AC

## 2011-05-02 NOTE — Progress Notes (Signed)
Patient came back today for ultrasound. Please see last visit for complaints. She is also complaining of a cough that is nonproductive without fever but  which is annoying her. On ultrasound her uterus looks normal and her IUD is in place. The endometrial echo is 6.1 mm. Both her ovaries are normal. Her cul-de-sac is free of fluid.  I listened to her chest. Her lungs are clear to P&A.  Assessment: Menorrhagia. Upper respiratory infection.  Plan: Reassured about her IUD. For the moment her periods are not heavy enough to be an issue. We will therefore just observed all the above. She will get an expectorant for her upper respiratory infection. We also called Z-Pak if he gets worse, she starts to run fever, or has a productive cough.

## 2011-05-13 DIAGNOSIS — D126 Benign neoplasm of colon, unspecified: Secondary | ICD-10-CM

## 2011-05-13 HISTORY — DX: Benign neoplasm of colon, unspecified: D12.6

## 2011-06-30 ENCOUNTER — Other Ambulatory Visit: Payer: Self-pay | Admitting: Family Medicine

## 2011-08-11 ENCOUNTER — Emergency Department (HOSPITAL_COMMUNITY): Payer: 59

## 2011-08-11 ENCOUNTER — Ambulatory Visit (HOSPITAL_COMMUNITY)
Admission: EM | Admit: 2011-08-11 | Discharge: 2011-08-13 | Disposition: A | Discharge status: Home / Self Care | Payer: 59 | Attending: Surgery | Admitting: Surgery

## 2011-08-11 ENCOUNTER — Encounter (HOSPITAL_COMMUNITY): Payer: Self-pay | Admitting: *Deleted

## 2011-08-11 DIAGNOSIS — K819 Cholecystitis, unspecified: Secondary | ICD-10-CM

## 2011-08-11 DIAGNOSIS — K8000 Calculus of gallbladder with acute cholecystitis without obstruction: Principal | ICD-10-CM | POA: Insufficient documentation

## 2011-08-11 DIAGNOSIS — K769 Liver disease, unspecified: Secondary | ICD-10-CM | POA: Diagnosis present

## 2011-08-11 DIAGNOSIS — I10 Essential (primary) hypertension: Secondary | ICD-10-CM | POA: Insufficient documentation

## 2011-08-11 DIAGNOSIS — E669 Obesity, unspecified: Secondary | ICD-10-CM | POA: Insufficient documentation

## 2011-08-11 DIAGNOSIS — E876 Hypokalemia: Secondary | ICD-10-CM | POA: Insufficient documentation

## 2011-08-11 DIAGNOSIS — K7689 Other specified diseases of liver: Secondary | ICD-10-CM | POA: Insufficient documentation

## 2011-08-11 LAB — BASIC METABOLIC PANEL
CO2: 29 mEq/L (ref 19–32)
Chloride: 96 mEq/L (ref 96–112)
Creatinine, Ser: 0.59 mg/dL (ref 0.50–1.10)
GFR calc Af Amer: >90 mL/min (ref >90)
Sodium: 137 mEq/L (ref 135–145)

## 2011-08-11 LAB — DIFFERENTIAL
Basophils Absolute: 0 10*3/uL (ref 0.0–0.1)
Lymphocytes Relative: 5 % — ABNORMAL LOW (ref 12–46)
Lymphs Abs: 1.1 10*3/uL (ref 0.7–4.0)
Monocytes Absolute: 0.7 10*3/uL (ref 0.1–1.0)
Neutro Abs: 19.1 10*3/uL — ABNORMAL HIGH (ref 1.7–7.7)

## 2011-08-11 LAB — HEPATIC FUNCTION PANEL
ALT: 12 U/L (ref 0–35)
AST: 16 U/L (ref 0–37)
Alkaline Phosphatase: 106 U/L (ref 39–117)
Total Protein: 7.4 g/dL (ref 6.0–8.3)

## 2011-08-11 LAB — CBC
HCT: 38.8 % (ref 36.0–46.0)
MCV: 75.2 fL — ABNORMAL LOW (ref 78.0–100.0)
Platelets: 412 10*3/uL — ABNORMAL HIGH (ref 150–400)
RBC: 5.16 MIL/uL — ABNORMAL HIGH (ref 3.87–5.11)
RDW: 15.3 % (ref 11.5–15.5)
WBC: 21 10*3/uL — ABNORMAL HIGH (ref 4.0–10.5)

## 2011-08-11 MED ORDER — DIPHENHYDRAMINE HCL 50 MG/ML IJ SOLN: 12.5000 mg | Freq: Four times a day (QID) | INTRAMUSCULAR | Status: DC | PRN

## 2011-08-11 MED ORDER — CHLORTHALIDONE 25 MG PO TABS
25.0000 mg | ORAL_TABLET | Freq: Every day | ORAL | Status: DC
  Administered 2011-08-12 – 2011-08-13 (×2): 25 mg via ORAL
  Filled 2011-08-11 (×2): qty 1

## 2011-08-11 MED ORDER — HYDROMORPHONE HCL PF 1 MG/ML IJ SOLN
1.0000 mg | Freq: Once | INTRAMUSCULAR | Status: AC
  Administered 2011-08-11: 1 mg via INTRAVENOUS
  Filled 2011-08-11: qty 1

## 2011-08-11 MED ORDER — IOHEXOL 300 MG/ML  SOLN
100.0000 mL | Freq: Once | INTRAMUSCULAR | Status: AC | PRN
  Administered 2011-08-11: 100 mL via INTRAVENOUS

## 2011-08-11 MED ORDER — ATENOLOL 50 MG PO TABS
50.0000 mg | ORAL_TABLET | Freq: Every day | ORAL | Status: DC
  Administered 2011-08-12 – 2011-08-13 (×2): 50 mg via ORAL
  Filled 2011-08-11 (×2): qty 1

## 2011-08-11 MED ORDER — PANTOPRAZOLE SODIUM 40 MG IV SOLR
40.0000 mg | Freq: Once | INTRAVENOUS | Status: AC
  Administered 2011-08-11: 40 mg via INTRAVENOUS

## 2011-08-11 MED ORDER — CIPROFLOXACIN IN D5W 400 MG/200ML IV SOLN
400.0000 mg | Freq: Two times a day (BID) | INTRAVENOUS | Status: DC
  Administered 2011-08-11 – 2011-08-12 (×2): 400 mg via INTRAVENOUS
  Filled 2011-08-11 (×4): qty 200

## 2011-08-11 MED ORDER — MORPHINE SULFATE 4 MG/ML IJ SOLN
INTRAMUSCULAR | Status: AC
  Administered 2011-08-11: 4 mg via INTRAVENOUS
  Filled 2011-08-11: qty 1

## 2011-08-11 MED ORDER — ONDANSETRON HCL 4 MG/2ML IJ SOLN
INTRAMUSCULAR | Status: AC
  Administered 2011-08-12: 4 mg via INTRAVENOUS
  Filled 2011-08-11: qty 2

## 2011-08-11 MED ORDER — MORPHINE SULFATE 4 MG/ML IJ SOLN
4.0000 mg | Freq: Once | INTRAMUSCULAR | Status: AC
  Administered 2011-08-11: 4 mg via INTRAVENOUS

## 2011-08-11 MED ORDER — ENOXAPARIN SODIUM 40 MG/0.4ML ~~LOC~~ SOLN
40.0000 mg | Freq: Once | SUBCUTANEOUS | Status: AC
  Administered 2011-08-11: 40 mg via SUBCUTANEOUS
  Filled 2011-08-11: qty 0.4

## 2011-08-11 MED ORDER — MORPHINE SULFATE 2 MG/ML IJ SOLN
1.0000 mg | INTRAMUSCULAR | Status: DC | PRN
  Administered 2011-08-11 – 2011-08-12 (×5): 2 mg via INTRAVENOUS
  Filled 2011-08-11 (×6): qty 1

## 2011-08-11 MED ORDER — HYDROMORPHONE HCL PF 1 MG/ML IJ SOLN
0.5000 mg | Freq: Once | INTRAMUSCULAR | Status: AC
  Administered 2011-08-11: 17:00:00 via INTRAVENOUS
  Filled 2011-08-11: qty 1

## 2011-08-11 MED ORDER — PANTOPRAZOLE SODIUM 40 MG IV SOLR
INTRAVENOUS | Status: AC
  Administered 2011-08-11: 40 mg via INTRAVENOUS
  Filled 2011-08-11: qty 40

## 2011-08-11 MED ORDER — ATENOLOL-CHLORTHALIDONE 50-25 MG PO TABS: 1.0000 | ORAL_TABLET | Freq: Every day | ORAL | Status: DC

## 2011-08-11 MED ORDER — SODIUM CHLORIDE 0.9 % IV BOLUS (SEPSIS)
1000.0000 mL | Freq: Once | INTRAVENOUS | Status: AC
  Administered 2011-08-11: 1000 mL via INTRAVENOUS

## 2011-08-11 MED ORDER — ONDANSETRON HCL 4 MG/2ML IJ SOLN
4.0000 mg | Freq: Four times a day (QID) | INTRAMUSCULAR | Status: DC | PRN
  Administered 2011-08-11 – 2011-08-12 (×3): 4 mg via INTRAVENOUS
  Filled 2011-08-11 (×2): qty 2

## 2011-08-11 MED ORDER — DIPHENHYDRAMINE HCL 12.5 MG/5ML PO ELIX: 12.5000 mg | ORAL_SOLUTION | Freq: Four times a day (QID) | ORAL | Status: DC | PRN

## 2011-08-11 MED ORDER — PANTOPRAZOLE SODIUM 40 MG IV SOLR
40.0000 mg | Freq: Every day | INTRAVENOUS | Status: DC
  Administered 2011-08-11 – 2011-08-12 (×2): 40 mg via INTRAVENOUS
  Filled 2011-08-11 (×4): qty 40

## 2011-08-11 MED ORDER — KCL IN DEXTROSE-NACL 20-5-0.45 MEQ/L-%-% IV SOLN
INTRAVENOUS | Status: DC
  Administered 2011-08-12: via INTRAVENOUS
  Filled 2011-08-11 (×3): qty 1000

## 2011-08-11 NOTE — ED Notes (Signed)
Pt states "I had the virus a couple of weeks ago but this pain feels different, I'm really miserable"

## 2011-08-11 NOTE — ED Notes (Signed)
Pt states pain is severe. Offered ice chips. Pt stated she felt nauseated earlier, but is improved now. Meds given. Will reassess in 10 mins for results.

## 2011-08-11 NOTE — H&P (Signed)
April [name] is an 46 y.o. female.   Chief Complaint: sharp pain in upper abdomen HPI: 46 year old Caucasian female came to the emergency room because of persistent epigastric pain. She states that the pain developed at 1 AM this morning. She describes it as sharp and severe. It is also constant. The pain did radiate to her right side. Because the pain was persistent she went to an urgent care facility which referred her to the emergency room here at [facility]. She developed diarrhea and vomiting as well. She denies any fevers or chills. She denies any acholic stools. She denies any trouble swallowing liquids or solids. She denies any melena or hematochezia. She did take some TUMS, ginger ale, and Gas-X without any relief. In retrospect, she states that she had some similar episodes like this in the past; however, they were not as severe and only lasted for about 30 minutes to one hour. She denies any weight loss. Her paternal grandfather and father both had colon cancer. She denies taking frequent NSAIDs.  Past Medical History  Diagnosis Date  . Bite from cat     secondary infection right hand sugery 10/08  . Hypertension     Past Surgical History  Procedure Date  . Tonsillectomy   . Pilonidal cyst / sinus excision   . Hand surgery     Cat Bite trauma  . Mirena     inserted 05-03-09    Family History  Problem Relation Age of Onset  . Heart disease Mother   . Diabetes Father   . Colon cancer Father   . Breast cancer Maternal Grandmother   . Hypertension Paternal Grandmother    Social History:  reports that she has quit smoking. She does not have any smokeless tobacco history on file. She reports that she drinks alcohol. She reports that she does not use illicit drugs.  Allergies:  Allergies  Allergen Reactions  . Amoxicillin     REACTION: hives  . Penicillins     REACTION: hives    Medications Prior to Admission  Medication Dose Route Frequency Provider Last Rate Last Dose    . HYDROmorphone (DILAUDID) injection 0.5 mg  0.5 mg Intravenous Once Rodena Medin, PA-C      . HYDROmorphone (DILAUDID) injection 1 mg  1 mg Intravenous Once Rodena Medin, PA-C   1 mg at 08/11/11 1404  . iohexol (OMNIPAQUE) 300 MG/ML solution 100 mL  100 mL Intravenous Once PRN Medication Radiologist, MD   100 mL at 08/11/11 1726  . morphine 4 MG/ML injection 4 mg  4 mg Intravenous Once Rodena Medin, PA-C   4 mg at 08/11/11 1235  . pantoprazole (PROTONIX) injection 40 mg  40 mg Intravenous Once Rodena Medin, PA-C   40 mg at 08/11/11 1234  . sodium chloride 0.9 % bolus 1,000 mL  1,000 mL Intravenous Once Rodena Medin, PA-C   1,000 mL at 08/11/11 1158   Medications Prior to Admission  Medication Sig Dispense Refill  . atenolol-chlorthalidone (TENORETIC) 50-25 MG per tablet TAKE 1 TABLET DAILY  100 tablet  2  . Calcium Carbonate-Vit D-Min (CALTRATE PLUS PO) Take by mouth.        . loratadine (CLARITIN) 10 MG tablet Take 10 mg by mouth daily.        . Multiple Vitamin (MULTIVITAMIN) capsule Take 1 capsule by mouth daily.          Results for orders placed during the hospital encounter of  08/11/11 (from the past 48 hour(s))  BASIC METABOLIC PANEL     Status: Abnormal   Collection Time   08/11/11 11:58 AM      Component Value Range Comment   Sodium 137  135 - 145 (mEq/L)    Potassium 3.2 (*) 3.5 - 5.1 (mEq/L)    Chloride 96  96 - 112 (mEq/L)    CO2 29  19 - 32 (mEq/L)    Glucose, Bld 145 (*) 70 - 99 (mg/dL)    BUN 10  6 - 23 (mg/dL)    Creatinine, Ser 1.61  0.50 - 1.10 (mg/dL)    Calcium 9.9  8.4 - 10.5 (mg/dL)    GFR calc non Af Amer >90  >90 (mL/min)    GFR calc Af Amer >90  >90 (mL/min)   CBC     Status: Abnormal   Collection Time   08/11/11 11:58 AM      Component Value Range Comment   WBC 21.0 (*) 4.0 - 10.5 (K/uL)    RBC 5.16 (*) 3.87 - 5.11 (MIL/uL)    Hemoglobin 12.5  12.0 - 15.0 (g/dL)    HCT 09.6  04.5 - 40.9 (%)    MCV 75.2 (*) 78.0 - 100.0 (fL)    MCH 24.2  (*) 26.0 - 34.0 (pg)    MCHC 32.2  30.0 - 36.0 (g/dL)    RDW 81.1  91.4 - 78.2 (%)    Platelets 412 (*) 150 - 400 (K/uL)   DIFFERENTIAL     Status: Abnormal   Collection Time   08/11/11 11:58 AM      Component Value Range Comment   Neutrophils Relative 91 (*) 43 - 77 (%)    Neutro Abs 19.1 (*) 1.7 - 7.7 (K/uL)    Lymphocytes Relative 5 (*) 12 - 46 (%)    Lymphs Abs 1.1  0.7 - 4.0 (K/uL)    Monocytes Relative 3  3 - 12 (%)    Monocytes Absolute 0.7  0.1 - 1.0 (K/uL)    Eosinophils Relative 0  0 - 5 (%)    Eosinophils Absolute 0.0  0.0 - 0.7 (K/uL)    Basophils Relative 0  0 - 1 (%)    Basophils Absolute 0.0  0.0 - 0.1 (K/uL)   HEPATIC FUNCTION PANEL     Status: Normal   Collection Time   08/11/11 11:58 AM      Component Value Range Comment   Total Protein 7.4  6.0 - 8.3 (g/dL)    Albumin 4.2  3.5 - 5.2 (g/dL)    AST 16  0 - 37 (U/L)    ALT 12  0 - 35 (U/L)    Alkaline Phosphatase 106  39 - 117 (U/L)    Total Bilirubin 0.4  0.3 - 1.2 (mg/dL)    Bilirubin, Direct <9.5  0.0 - 0.3 (mg/dL)    Indirect Bilirubin NOT CALCULATED  0.3 - 0.9 (mg/dL)   LIPASE, BLOOD     Status: Normal   Collection Time   08/11/11 11:58 AM      Component Value Range Comment   Lipase 21  11 - 59 (U/L)    RADIOLOGICAL STUDIES: I have personally reviewed the radiological exams myself  Ct Abdomen Pelvis W Contrast  08/11/2011  *RADIOLOGY REPORT*  Clinical Data: Upper abdominal pain.  Nausea and vomiting.  CT ABDOMEN AND PELVIS WITH CONTRAST  Technique:  Multidetector CT imaging of the abdomen and pelvis was performed following the standard protocol  during bolus administration of intravenous contrast.  Contrast:  100 ml Omnipaque-300  Comparison: Radiography same day  Findings: There is dependent atelectasis in both lower lobes. There is a small amount of pleural fluid.  No pericardial fluid.  There is a 2.4 cm low density lesion laterally within the posterior segment of the right lobe of the liver.  On delayed  imaging, this enhances slightly but does remain hypodense to normal liver.  No other focal liver lesion.  The gallbladder is distended and there is a large stone in the gallbladder neck that measures 3.1 cm in diameter.  No pericholecystic fluid.  The spleen is normal.  The pancreas is normal.  The adrenal glands are normal.  The kidneys are normal.  The aorta and IVC are normal. No retroperitoneal mass or adenopathy.  No free intraperitoneal fluid or air.  IUD is present within the uterus.  The ovaries appear normal.  No primary bowel pathology is evident.  No significant bony finding.  IMPRESSION: 3.1 cm gallstone in the gallbladder neck.  Distended gallbladder. Findings could indicate early cholecystitis.  2.4 cm low density lesion in the right lobe of the liver that enhances slightly after contrast administration.  MRI of the liver would be suggested for further evaluation after this acute illness has resolved.  This could be a benign or malignant liver mass.  Rather considerable atelectasis in both lower lobes.  Original Report Authenticated By: Thomasenia Sales, M.D.   Dg Abd Acute W/chest  08/11/2011  *RADIOLOGY REPORT*  Clinical Data: Mid abdominal pain, nausea, vomiting, diarrhea, history hypertension  ACUTE ABDOMEN SERIES (ABDOMEN 2 VIEW & CHEST 1 VIEW)  Comparison: None  Findings: Minimal enlargement of cardiac silhouette. Mediastinal contours and pulmonary vascularity normal. Lungs clear. IUD projects over left sacrum. Scattered pelvic phleboliths. Nonobstructive bowel gas pattern. No bowel dilatation, bowel wall thickening, or free intraperitoneal air. Bones unremarkable. No definite urinary tract calcifications.  IMPRESSION: Nonobstructive bowel gas pattern. Minimal enlargement of cardiac silhouette.  Original Report Authenticated By: Lollie Marrow, M.D.    Review of Systems  Constitutional: Negative for fever, chills, weight loss and diaphoresis.  HENT: Negative for hearing loss and nosebleeds.    Eyes: Negative for double vision and photophobia.  Respiratory: Negative for sputum production and shortness of breath.   Cardiovascular: Negative for chest pain, palpitations, orthopnea, leg swelling and PND.  Gastrointestinal:       See hpi  Genitourinary: Negative for dysuria, frequency and hematuria.  Musculoskeletal: Negative for myalgias.  Skin: Negative for itching and rash.  Neurological: Negative for dizziness, speech change, focal weakness, seizures, loss of consciousness and weakness.  Endo/Heme/Allergies: Does not bruise/bleed easily.  Psychiatric/Behavioral: Negative for suicidal ideas.    Blood pressure 127/68, pulse 90, temperature 98.9 F (37.2 C), temperature source Oral, resp. rate 20, weight 244 lb (110.678 kg), last menstrual period 07/11/2011, SpO2 98.00%. Physical Exam  Vitals reviewed. Constitutional: She is oriented to person, place, and time. She appears well-developed and well-nourished. No distress.  HENT:  Head: Normocephalic and atraumatic.  Right Ear: External ear normal.  Left Ear: External ear normal.  Nose: Nose normal.  Mouth/Throat: No oropharyngeal exudate.  Eyes: Conjunctivae are normal. Pupils are equal, round, and reactive to light. No scleral icterus.  Neck: Neck supple. No JVD present. No tracheal deviation present. No thyromegaly present.  Cardiovascular: Normal rate, regular rhythm and normal heart sounds.   Respiratory: Effort normal and breath sounds normal. No respiratory distress. She has no wheezes.  GI: Soft. Bowel sounds are normal. She exhibits no distension. There is tenderness (epigastric, RUQ). There is positive Murphy's sign. There is no rigidity, no rebound and no guarding.  Musculoskeletal: Normal range of motion. She exhibits no edema and no tenderness.  Lymphadenopathy:    She has no cervical adenopathy.  Neurological: She is alert and oriented to person, place, and time. She exhibits normal muscle tone.  Skin: Skin is warm  and dry. No rash noted. She is not diaphoretic. No erythema.  Psychiatric: She has a normal mood and affect. Her behavior is normal. Judgment and thought content normal.     Assessment/Plan Patient Active Problem List  Diagnoses  . OBESITY  . Hypertension  . Acute calculous cholecystitis  . Liver lesion, right lobe  - hypokalemia  --Admit pt for IVF, IV abx, cholecystectomy. Explained to pt that cholecystectomy will likely occur on Wednesday. --replace potassium  --Discussed with pt and mother the ct findings including the rt hepatic lesion, most likely benign. But will need additional imaging to further characterize. Will defer to Dr Derrell Lolling timing of imaging.   We discussed gallbladder disease. The patient was given a diagram I drew. We discussed non-operative and operative management.   I discussed laparoscopic cholecystectomy with ioc in detail.  The patient was given a diagram detailing the procedure.  We discussed the risks and benefits of a laparoscopic cholecystectomy including, but not limited to bleeding, infection, injury to surrounding structures such as the intestine or liver, bile leak, retained gallstones, need to convert to an open procedure, prolonged diarrhea, blood clots such as  DVT, common bile duct injury, anesthesia risks, and possible need for additional procedures.  We discussed the typical post-operative recovery course. I explained that the likelihood of improvement of their symptoms is good.  Mary Sella. Andrey Campanile, MD, FACS General, Bariatric, & Minimally Invasive Surgery [facility], [place]     [facility] M 08/11/2011, 7:34 PM

## 2011-08-11 NOTE — ED Provider Notes (Signed)
History     CSN: [id number]  Arrival date & time 08/11/11  1049   First MD Initiated Contact with Patient 08/11/11 1127      Chief Complaint  Patient presents with  . Abdominal Pain    indicates epigastricpain    (Consider location/radiation/quality/duration/timing/severity/associated sxs/prior treatment) Patient is a 46 y.o. female presenting with abdominal pain. The history is provided by the patient.  Abdominal Pain The primary symptoms of the illness include abdominal pain, vomiting and diarrhea. The primary symptoms of the illness do not include fever. The current episode started 6 to 12 hours ago. The onset of the illness was gradual.  The abdominal pain is located in the epigastric region. The abdominal pain does not radiate. The severity of the abdominal pain is 8/10. The abdominal pain is relieved by nothing.  Vomiting occurs 2 to 5 times per day.  The diarrhea occurs 5 to 10 times per day.  Symptoms associated with the illness do not include chills. Associated symptoms comments: She started having symptoms of epigastric pain with vomiting and more frequent diarrhea. No fever. She was seen at Urgent Care and treated with unknown medication for nausea and reports improvement but still has epigastric pain. .    Past Medical History  Diagnosis Date  . Bite from cat     secondary infection right hand sugery 10/08  . Hypertension     Past Surgical History  Procedure Date  . Tonsillectomy   . Pilonidal cyst / sinus excision   . Hand surgery     Cat Bite trauma  . Mirena     inserted 05-03-09    Family History  Problem Relation Age of Onset  . Heart disease Mother   . Diabetes Father   . Colon cancer Father   . Breast cancer Maternal Grandmother   . Hypertension Paternal Grandmother     History  Substance Use Topics  . Smoking status: Former Games developer  . Smokeless tobacco: Not on file  . Alcohol Use: Yes     rare    OB History    Grav Para Term Preterm  Abortions TAB SAB Ect Mult Living   0               Review of Systems  Constitutional: Negative for fever and chills.  HENT: Negative.   Respiratory: Negative.   Cardiovascular: Negative.   Gastrointestinal: Positive for vomiting, abdominal pain and diarrhea.  Musculoskeletal: Negative.   Skin: Negative.   Neurological: Negative.     Allergies  Amoxicillin and Penicillins  Home Medications   Current Outpatient Rx  Name Route Sig Dispense Refill  . ATENOLOL-CHLORTHALIDONE 50-25 MG PO TABS  TAKE 1 TABLET DAILY 100 tablet 2  . CALTRATE PLUS PO Oral Take by mouth.      Marland Kitchen LORATADINE 10 MG PO TABS Oral Take 10 mg by mouth daily.      . MULTIVITAMINS PO CAPS Oral Take 1 capsule by mouth daily.        BP 116/70  Pulse 81  Temp(Src) 97.8 F (36.6 C) (Oral)  Resp 18  Wt 244 lb (110.678 kg)  SpO2 93%  LMP 07/11/2011  Physical Exam  Constitutional: She appears well-developed and well-nourished.  HENT:  Head: Normocephalic.  Neck: Normal range of motion. Neck supple.  Cardiovascular: Normal rate and regular rhythm.   Pulmonary/Chest: Effort normal and breath sounds normal.  Abdominal: Soft. Bowel sounds are normal. There is tenderness. There is no rebound and no guarding.  Nontender RUQ and LUQ.  Musculoskeletal: Normal range of motion.  Neurological: She is alert. No cranial nerve deficit.  Skin: Skin is warm and dry. No rash noted.  Psychiatric: She has a normal mood and affect.    ED Course  Procedures (including critical care time) Results for orders placed during the hospital encounter of 08/11/11  BASIC METABOLIC PANEL      Component Value Range   Sodium 137  135 - 145 (mEq/L)   Potassium 3.2 (*) 3.5 - 5.1 (mEq/L)   Chloride 96  96 - 112 (mEq/L)   CO2 29  19 - 32 (mEq/L)   Glucose, Bld 145 (*) 70 - 99 (mg/dL)   BUN 10  6 - 23 (mg/dL)   Creatinine, Ser 1.47  0.50 - 1.10 (mg/dL)   Calcium 9.9  8.4 - 82.9 (mg/dL)   GFR calc non Af Amer >90  >90  (mL/min)   GFR calc Af Amer >90  >90 (mL/min)  CBC      Component Value Range   WBC 21.0 (*) 4.0 - 10.5 (K/uL)   RBC 5.16 (*) 3.87 - 5.11 (MIL/uL)   Hemoglobin 12.5  12.0 - 15.0 (g/dL)   HCT 56.2  13.0 - 86.5 (%)   MCV 75.2 (*) 78.0 - 100.0 (fL)   MCH 24.2 (*) 26.0 - 34.0 (pg)   MCHC 32.2  30.0 - 36.0 (g/dL)   RDW 78.4  69.6 - 29.5 (%)   Platelets 412 (*) 150 - 400 (K/uL)  DIFFERENTIAL      Component Value Range   Neutrophils Relative 91 (*) 43 - 77 (%)   Neutro Abs 19.1 (*) 1.7 - 7.7 (K/uL)   Lymphocytes Relative 5 (*) 12 - 46 (%)   Lymphs Abs 1.1  0.7 - 4.0 (K/uL)   Monocytes Relative 3  3 - 12 (%)   Monocytes Absolute 0.7  0.1 - 1.0 (K/uL)   Eosinophils Relative 0  0 - 5 (%)   Eosinophils Absolute 0.0  0.0 - 0.7 (K/uL)   Basophils Relative 0  0 - 1 (%)   Basophils Absolute 0.0  0.0 - 0.1 (K/uL)  HEPATIC FUNCTION PANEL      Component Value Range   Total Protein 7.4  6.0 - 8.3 (g/dL)   Albumin 4.2  3.5 - 5.2 (g/dL)   AST 16  0 - 37 (U/L)   ALT 12  0 - 35 (U/L)   Alkaline Phosphatase 106  39 - 117 (U/L)   Total Bilirubin 0.4  0.3 - 1.2 (mg/dL)   Bilirubin, Direct <2.8  0.0 - 0.3 (mg/dL)   Indirect Bilirubin NOT CALCULATED  0.3 - 0.9 (mg/dL)  LIPASE, BLOOD      Component Value Range   Lipase 21  11 - 59 (U/L)    Labs Reviewed - No data to display Dg Abd Acute W/chest  08/11/2011  *RADIOLOGY REPORT*  Clinical Data: Mid abdominal pain, nausea, vomiting, diarrhea, history hypertension  ACUTE ABDOMEN SERIES (ABDOMEN 2 VIEW & CHEST 1 VIEW)  Comparison: None  Findings: Minimal enlargement of cardiac silhouette. Mediastinal contours and pulmonary vascularity normal. Lungs clear. IUD projects over left sacrum. Scattered pelvic phleboliths. Nonobstructive bowel gas pattern. No bowel dilatation, bowel wall thickening, or free intraperitoneal air. Bones unremarkable. No definite urinary tract calcifications.  IMPRESSION: Nonobstructive bowel gas pattern. Minimal enlargement of cardiac  silhouette.  Original Report Authenticated By: Lollie Marrow, M.D.     No diagnosis found. 1. GALL STONES, likely cholecystitis  MDM  The patient's WBC significantly elevated with persistent epigastric tenderness on re-evaluation. LFT's normal and patient is not appreciably tender over RUQ which does not support concern for gall bladder origin of symptoms. Will CT to further evaluate.        Rodena Medin, PA-C 08/11/11 1814

## 2011-08-11 NOTE — ED Notes (Signed)
Patient transported to X-ray now returned.

## 2011-08-11 NOTE — ED Notes (Signed)
Pt takes Atenolol for b/p. HR 77....buccall membranes dry, tongue sticking to roof of pt's mouth. Pt states she feels very thirsty.

## 2011-08-12 ENCOUNTER — Inpatient Hospital Stay (HOSPITAL_COMMUNITY): Payer: 59

## 2011-08-12 ENCOUNTER — Encounter (HOSPITAL_COMMUNITY): Admission: EM | Disposition: A | Discharge status: Home / Self Care | Payer: Self-pay | Source: Home / Self Care

## 2011-08-12 ENCOUNTER — Encounter (HOSPITAL_COMMUNITY): Payer: Self-pay | Admitting: Anesthesiology

## 2011-08-12 ENCOUNTER — Inpatient Hospital Stay (HOSPITAL_COMMUNITY): Payer: 59 | Admitting: Anesthesiology

## 2011-08-12 HISTORY — PX: CHOLECYSTECTOMY: SHX55

## 2011-08-12 LAB — CBC
HCT: 35.1 % — ABNORMAL LOW (ref 36.0–46.0)
Hemoglobin: 11.4 g/dL — ABNORMAL LOW (ref 12.0–15.0)
MCH: 24.6 pg — ABNORMAL LOW (ref 26.0–34.0)
MCHC: 32.5 g/dL (ref 30.0–36.0)
MCV: 75.8 fL — ABNORMAL LOW (ref 78.0–100.0)
RBC: 4.63 MIL/uL (ref 3.87–5.11)

## 2011-08-12 LAB — MRSA PCR SCREENING: MRSA by PCR: NEGATIVE

## 2011-08-12 LAB — COMPREHENSIVE METABOLIC PANEL
ALT: 10 U/L (ref 0–35)
Albumin: 3.1 g/dL — ABNORMAL LOW (ref 3.5–5.2)
Alkaline Phosphatase: 93 U/L (ref 39–117)
BUN: 5 mg/dL — ABNORMAL LOW (ref 6–23)
Chloride: 98 mEq/L (ref 96–112)
Potassium: 2.9 mEq/L — ABNORMAL LOW (ref 3.5–5.1)
Sodium: 136 mEq/L (ref 135–145)
Total Bilirubin: 0.4 mg/dL (ref 0.3–1.2)

## 2011-08-12 LAB — MAGNESIUM: Magnesium: 1.5 mg/dL (ref 1.5–2.5)

## 2011-08-12 SURGERY — LAPAROSCOPIC CHOLECYSTECTOMY WITH INTRAOPERATIVE CHOLANGIOGRAM
Anesthesia: General | Site: Abdomen | Wound class: Contaminated

## 2011-08-12 MED ORDER — HEPARIN SODIUM (PORCINE) 5000 UNIT/ML IJ SOLN
5000.0000 [IU] | Freq: Three times a day (TID) | INTRAMUSCULAR | Status: DC
  Administered 2011-08-12 – 2011-08-13 (×3): 5000 [IU] via SUBCUTANEOUS
  Filled 2011-08-12 (×6): qty 1

## 2011-08-12 MED ORDER — LACTATED RINGERS IR SOLN
Status: DC | PRN
  Administered 2011-08-12: 1000 mL

## 2011-08-12 MED ORDER — SUCCINYLCHOLINE CHLORIDE 20 MG/ML IJ SOLN
INTRAMUSCULAR | Status: DC | PRN
  Administered 2011-08-12: 100 mg via INTRAVENOUS

## 2011-08-12 MED ORDER — HEMOSTATIC AGENTS (NO CHARGE) OPTIME
TOPICAL | Status: DC | PRN
  Administered 2011-08-12: 1 via TOPICAL

## 2011-08-12 MED ORDER — CIPROFLOXACIN IN D5W 400 MG/200ML IV SOLN
INTRAVENOUS | Status: DC | PRN
  Administered 2011-08-12: 400 mg via INTRAVENOUS

## 2011-08-12 MED ORDER — POTASSIUM CHLORIDE IN NACL 40-0.9 MEQ/L-% IV SOLN
INTRAVENOUS | Status: DC
  Administered 2011-08-12: 07:00:00 via INTRAVENOUS
  Filled 2011-08-12 (×5): qty 1000

## 2011-08-12 MED ORDER — CIPROFLOXACIN IN D5W 400 MG/200ML IV SOLN
400.0000 mg | Freq: Two times a day (BID) | INTRAVENOUS | Status: DC
  Administered 2011-08-12 – 2011-08-13 (×2): 400 mg via INTRAVENOUS
  Filled 2011-08-12 (×4): qty 200

## 2011-08-12 MED ORDER — POTASSIUM CHLORIDE 10 MEQ/100ML IV SOLN
10.0000 meq | INTRAVENOUS | Status: AC
  Administered 2011-08-12 (×4): 10 meq via INTRAVENOUS
  Filled 2011-08-12 (×5): qty 100

## 2011-08-12 MED ORDER — ACETAMINOPHEN 10 MG/ML IV SOLN
INTRAVENOUS | Status: AC
  Filled 2011-08-12: qty 100

## 2011-08-12 MED ORDER — SODIUM CHLORIDE 0.9 % IV SOLN: INTRAVENOUS | Status: DC

## 2011-08-12 MED ORDER — LIDOCAINE HCL (CARDIAC) 20 MG/ML IV SOLN
INTRAVENOUS | Status: DC | PRN
  Administered 2011-08-12: 50 mg via INTRAVENOUS

## 2011-08-12 MED ORDER — ROCURONIUM BROMIDE 100 MG/10ML IV SOLN
INTRAVENOUS | Status: DC | PRN
  Administered 2011-08-12: 30 mg via INTRAVENOUS
  Administered 2011-08-12: 10 mg via INTRAVENOUS

## 2011-08-12 MED ORDER — CIPROFLOXACIN IN D5W 400 MG/200ML IV SOLN
INTRAVENOUS | Status: AC
  Filled 2011-08-12: qty 200

## 2011-08-12 MED ORDER — ONDANSETRON HCL 4 MG PO TABS: 4.0000 mg | ORAL_TABLET | Freq: Four times a day (QID) | ORAL | Status: DC | PRN

## 2011-08-12 MED ORDER — DEXAMETHASONE SODIUM PHOSPHATE 10 MG/ML IJ SOLN
INTRAMUSCULAR | Status: DC | PRN
  Administered 2011-08-12: 10 mg via INTRAVENOUS

## 2011-08-12 MED ORDER — BUPIVACAINE-EPINEPHRINE (PF) 0.5% -1:200000 IJ SOLN
INTRAMUSCULAR | Status: AC
  Filled 2011-08-12: qty 10

## 2011-08-12 MED ORDER — DROPERIDOL 2.5 MG/ML IJ SOLN
INTRAMUSCULAR | Status: DC | PRN
  Administered 2011-08-12: 0.625 mg via INTRAVENOUS

## 2011-08-12 MED ORDER — LACTATED RINGERS IV SOLN
INTRAVENOUS | Status: DC | PRN
  Administered 2011-08-12 (×2): via INTRAVENOUS

## 2011-08-12 MED ORDER — POTASSIUM CHLORIDE IN NACL 20-0.9 MEQ/L-% IV SOLN
INTRAVENOUS | Status: DC
  Administered 2011-08-12 – 2011-08-13 (×2): via INTRAVENOUS
  Filled 2011-08-12 (×7): qty 1000

## 2011-08-12 MED ORDER — PROMETHAZINE HCL 25 MG/ML IJ SOLN: 6.2500 mg | INTRAMUSCULAR | Status: DC | PRN

## 2011-08-12 MED ORDER — FENTANYL CITRATE 0.05 MG/ML IJ SOLN
INTRAMUSCULAR | Status: DC | PRN
  Administered 2011-08-12 (×2): 50 ug via INTRAVENOUS
  Administered 2011-08-12: 100 ug via INTRAVENOUS
  Administered 2011-08-12: 50 ug via INTRAVENOUS

## 2011-08-12 MED ORDER — MORPHINE SULFATE 2 MG/ML IJ SOLN: 2.0000 mg | INTRAMUSCULAR | Status: DC | PRN

## 2011-08-12 MED ORDER — KETOROLAC TROMETHAMINE 30 MG/ML IJ SOLN: 15.0000 mg | Freq: Once | INTRAMUSCULAR | Status: DC | PRN

## 2011-08-12 MED ORDER — IOHEXOL 300 MG/ML  SOLN
INTRAMUSCULAR | Status: AC
  Filled 2011-08-12: qty 1

## 2011-08-12 MED ORDER — PROPOFOL 10 MG/ML IV EMUL
INTRAVENOUS | Status: DC | PRN
  Administered 2011-08-12: 200 mg via INTRAVENOUS

## 2011-08-12 MED ORDER — ONDANSETRON HCL 4 MG/2ML IJ SOLN
INTRAMUSCULAR | Status: DC | PRN
  Administered 2011-08-12: 4 mg via INTRAVENOUS

## 2011-08-12 MED ORDER — BUPIVACAINE-EPINEPHRINE 0.5% -1:200000 IJ SOLN
INTRAMUSCULAR | Status: DC | PRN
  Administered 2011-08-12: 20 mL

## 2011-08-12 MED ORDER — LACTATED RINGERS IV SOLN
INTRAVENOUS | Status: DC
  Administered 2011-08-12: 1000 mL via INTRAVENOUS

## 2011-08-12 MED ORDER — ACETAMINOPHEN 10 MG/ML IV SOLN
1000.0000 mg | Freq: Four times a day (QID) | INTRAVENOUS | Status: DC
  Administered 2011-08-12: 1000 mg via INTRAVENOUS
  Filled 2011-08-12: qty 100

## 2011-08-12 MED ORDER — ONDANSETRON HCL 4 MG/2ML IJ SOLN: 4.0000 mg | Freq: Four times a day (QID) | INTRAMUSCULAR | Status: DC | PRN

## 2011-08-12 MED ORDER — HYDROMORPHONE HCL PF 1 MG/ML IJ SOLN: 0.2500 mg | INTRAMUSCULAR | Status: DC | PRN

## 2011-08-12 MED ORDER — HYDROCODONE-ACETAMINOPHEN 5-325 MG PO TABS
1.0000 | ORAL_TABLET | ORAL | Status: DC | PRN
  Administered 2011-08-13 (×3): 1 via ORAL
  Filled 2011-08-12 (×2): qty 1
  Filled 2011-08-12: qty 2

## 2011-08-12 SURGICAL SUPPLY — 39 items
ADH SKN CLS APL DERMABOND .7 (GAUZE/BANDAGES/DRESSINGS)
APL SKNCLS STERI-STRIP NONHPOA (GAUZE/BANDAGES/DRESSINGS) ×1
APPLIER CLIP ROT 10 11.4 M/L (STAPLE) ×2
APR CLP MED LRG 11.4X10 (STAPLE) ×1
BAG SPEC RTRVL LRG 6X4 10 (ENDOMECHANICALS) ×1
BENZOIN TINCTURE PRP APPL 2/3 (GAUZE/BANDAGES/DRESSINGS) ×2 IMPLANT
CANISTER SUCTION 2500CC (MISCELLANEOUS) ×2 IMPLANT
CLIP APPLIE ROT 10 11.4 M/L (STAPLE) ×1 IMPLANT
CLOTH BEACON ORANGE TIMEOUT ST (SAFETY) ×2 IMPLANT
CORD HIGH FREQUENCY UNIPOLAR (ELECTROSURGICAL) ×1 IMPLANT
COVER MAYO STAND STRL (DRAPES) ×2 IMPLANT
DECANTER SPIKE VIAL GLASS SM (MISCELLANEOUS) ×2 IMPLANT
DERMABOND ADVANCED (GAUZE/BANDAGES/DRESSINGS)
DERMABOND ADVANCED .7 DNX12 (GAUZE/BANDAGES/DRESSINGS) ×1 IMPLANT
DRAPE C-ARM 42X72 X-RAY (DRAPES) ×2 IMPLANT
DRAPE LAPAROSCOPIC ABDOMINAL (DRAPES) ×2 IMPLANT
ELECT REM PT RETURN 9FT ADLT (ELECTROSURGICAL) ×2
ELECTRODE REM PT RTRN 9FT ADLT (ELECTROSURGICAL) ×1 IMPLANT
GLOVE BIOGEL PI IND STRL 7.0 (GLOVE) ×1 IMPLANT
GLOVE BIOGEL PI INDICATOR 7.0 (GLOVE) ×1
GLOVE EUDERMIC 7 POWDERFREE (GLOVE) ×2 IMPLANT
GOWN STRL NON-REIN LRG LVL3 (GOWN DISPOSABLE) ×2 IMPLANT
GOWN STRL REIN XL XLG (GOWN DISPOSABLE) ×4 IMPLANT
HEMOSTAT SNOW SURGICEL 2X4 (HEMOSTASIS) ×1 IMPLANT
HEMOSTAT SURGICEL 4X8 (HEMOSTASIS) IMPLANT
KIT BASIN OR (CUSTOM PROCEDURE TRAY) ×2 IMPLANT
NS IRRIG 1000ML POUR BTL (IV SOLUTION) ×2 IMPLANT
POUCH SPECIMEN RETRIEVAL 10MM (ENDOMECHANICALS) ×1 IMPLANT
SET CHOLANGIOGRAPH MIX (MISCELLANEOUS) ×2 IMPLANT
SET IRRIG TUBING LAPAROSCOPIC (IRRIGATION / IRRIGATOR) ×2 IMPLANT
SOLUTION ANTI FOG 6CC (MISCELLANEOUS) ×2 IMPLANT
STRIP CLOSURE SKIN 1/2X4 (GAUZE/BANDAGES/DRESSINGS) ×2 IMPLANT
SUT MNCRL AB 4-0 PS2 18 (SUTURE) ×2 IMPLANT
TOWEL OR 17X26 10 PK STRL BLUE (TOWEL DISPOSABLE) ×5 IMPLANT
TRAY LAP CHOLE (CUSTOM PROCEDURE TRAY) ×2 IMPLANT
TROCAR BLADELESS OPT 5 75 (ENDOMECHANICALS) ×2 IMPLANT
TROCAR XCEL BLUNT TIP 100MML (ENDOMECHANICALS) ×2 IMPLANT
TROCAR XCEL NON-BLD 11X100MML (ENDOMECHANICALS) ×1 IMPLANT
TUBING INSUFFLATION 10FT LAP (TUBING) ×2 IMPLANT

## 2011-08-12 NOTE — Transfer of Care (Signed)
Immediate Anesthesia Transfer of Care Note  Patient: April [name]  Procedure(s) Performed: Procedure(s) (LRB): LAPAROSCOPIC CHOLECYSTECTOMY WITH INTRAOPERATIVE CHOLANGIOGRAM (N/A)  Patient Location: PACU  Anesthesia Type: General  Level of Consciousness: awake, alert  and oriented  Airway & Oxygen Therapy: Patient Spontanous Breathing and Patient connected to face mask oxygen  Post-op Assessment: Report given to PACU RN and Post -op Vital signs reviewed and stable  Post vital signs: Reviewed and stable  Complications: No apparent anesthesia complications

## 2011-08-12 NOTE — Progress Notes (Signed)
Subjective: Pain is partially relieved with analgesics, but she still has epigastric and right upper quadrant pain. No vomiting overnight. Low-grade fever. Vital signs stable. Heart rate 84.  WBC still 21,000. LFTs normal. Lipase normal.  We discussed the issues of her acute cholecystitis as well as the issue of the 2.4 cm density in her right hepatic lobe.  Objective: Vital signs in last 24 hours: Temp:  [97.8 F (36.6 C)-100.3 F (37.9 C)] 100.3 F (37.9 C) (04/01 2125) Pulse Rate:  [81-90] 84  (04/01 2125) Resp:  [18-20] 20  (04/01 2125) BP: (116-138)/(68-70) 138/70 mmHg (04/01 2125) SpO2:  [93 %-98 %] 95 % (04/01 2125) Weight:  [244 lb (110.678 kg)] 244 lb (110.678 kg) (04/02 0300) Last BM Date: 08/11/11  Intake/Output from previous day: 04/01 0701 - 04/02 0700 In: 0  Out: 1650 [Urine:1650] Intake/Output this shift: Total I/O In: 0  Out: 1650 [Urine:1650]  General appearance: pleasant. Alert. Mild distress from abdominal pain. Mental status normal. Does not look toxic. GI: abdomen is obese. Nondistended. Tender with guarding in epigastrium and right upper quadrant. I do not feel a mass.  Lab Results:   Basename 08/12/11 0400 08/11/11 1158  WBC 20.5* 21.0*  HGB 11.4* 12.5  HCT 35.1* 38.8  PLT 358 412*   BMET  Basename 08/12/11 0400 08/11/11 1158  NA 136 137  K 2.9* 3.2*  CL 98 96  CO2 27 29  GLUCOSE 140* 145*  BUN 5* 10  CREATININE 0.63 0.59  CALCIUM 8.8 9.9   PT/INR No results found for this basename: LABPROT:2,INR:2 in the last 72 hours ABG No results found for this basename: PHART:2,PCO2:2,PO2:2,HCO3:2 in the last 72 hours  Studies/Results: Ct Abdomen Pelvis W Contrast  08/11/2011  *RADIOLOGY REPORT*  Clinical Data: Upper abdominal pain.  Nausea and vomiting.  CT ABDOMEN AND PELVIS WITH CONTRAST  Technique:  Multidetector CT imaging of the abdomen and pelvis was performed following the standard protocol during bolus administration of intravenous  contrast.  Contrast:  100 ml Omnipaque-300  Comparison: Radiography same day  Findings: There is dependent atelectasis in both lower lobes. There is a small amount of pleural fluid.  No pericardial fluid.  There is a 2.4 cm low density lesion laterally within the posterior segment of the right lobe of the liver.  On delayed imaging, this enhances slightly but does remain hypodense to normal liver.  No other focal liver lesion.  The gallbladder is distended and there is a large stone in the gallbladder neck that measures 3.1 cm in diameter.  No pericholecystic fluid.  The spleen is normal.  The pancreas is normal.  The adrenal glands are normal.  The kidneys are normal.  The aorta and IVC are normal. No retroperitoneal mass or adenopathy.  No free intraperitoneal fluid or air.  IUD is present within the uterus.  The ovaries appear normal.  No primary bowel pathology is evident.  No significant bony finding.  IMPRESSION: 3.1 cm gallstone in the gallbladder neck.  Distended gallbladder. Findings could indicate early cholecystitis.  2.4 cm low density lesion in the right lobe of the liver that enhances slightly after contrast administration.  MRI of the liver would be suggested for further evaluation after this acute illness has resolved.  This could be a benign or malignant liver mass.  Rather considerable atelectasis in both lower lobes.  Original Report Authenticated By: Thomasenia Sales, M.D.   Dg Abd Acute W/chest  08/11/2011  *RADIOLOGY REPORT*  Clinical Data: Mid abdominal  pain, nausea, vomiting, diarrhea, history hypertension  ACUTE ABDOMEN SERIES (ABDOMEN 2 VIEW & CHEST 1 VIEW)  Comparison: None  Findings: Minimal enlargement of cardiac silhouette. Mediastinal contours and pulmonary vascularity normal. Lungs clear. IUD projects over left sacrum. Scattered pelvic phleboliths. Nonobstructive bowel gas pattern. No bowel dilatation, bowel wall thickening, or free intraperitoneal air. Bones unremarkable. No definite  urinary tract calcifications.  IMPRESSION: Nonobstructive bowel gas pattern. Minimal enlargement of cardiac silhouette.  Original Report Authenticated By: Lollie Marrow, M.D.    Anti-infectives: Anti-infectives     Start     Dose/Rate Route Frequency Ordered Stop   08/11/11 2000   ciprofloxacin (CIPRO) IVPB 400 mg     Comments: FIRST DOSE STAT      400 mg 200 mL/hr over 60 Minutes Intravenous Every 12 hours 08/11/11 1956            Assessment/Plan:   Acute cholecystitis with cholelithiasis. Degree of pain, tenderness, and leukocytosis suggest that this is the priority in her management. I told her that it would be in her best interest to proceed with cholecystectomy today and she agrees.  It is possible that we may be able to visualize and even biopsy the liver lesion. If not, they will need evaluation at a later date. She is comfortable with this  algorithm and sequencing of her care.  We talked about the gallbladder surgery and its risks once again. She understands all of this. Her questions were answered. She agrees with this plan.  Hypokalemia is noted. IV therapy is ordered.   LOS: 1 day    Navid Lenzen M. Derrell Lolling, M.D., [facility] General and Minimally invasive Surgery Breast and Colorectal Surgery Office:   [phone] Pager:   [phone]  08/12/2011

## 2011-08-12 NOTE — Anesthesia Postprocedure Evaluation (Signed)
  Anesthesia Post-op Note  Patient: April [name]  Procedure(s) Performed: Procedure(s) (LRB): LAPAROSCOPIC CHOLECYSTECTOMY WITH INTRAOPERATIVE CHOLANGIOGRAM (N/A)  Patient Location: PACU  Anesthesia Type: General  Level of Consciousness: awake and alert   Airway and Oxygen Therapy: Patient Spontanous Breathing  Post-op Pain: mild  Post-op Assessment: Post-op Vital signs reviewed, Patient's Cardiovascular Status Stable, Respiratory Function Stable, Patent Airway and No signs of Nausea or vomiting  Post-op Vital Signs: stable  Complications: No apparent anesthesia complications

## 2011-08-12 NOTE — Op Note (Signed)
Patient Name:           April [name]   Date of Surgery:        08/12/2011  Pre op Diagnosis:      Acute cholecystitis with cholelithiasis  Post op Diagnosis:    same  Procedure:                 Laparoscopic cholecystectomy with intraoperative cholangiogram  Surgeon:                     Angelia Mould. Derrell Lolling, M.D., FACS  Assistant:                      Chevis Pretty, M.D., [facility]  Operative Indications:   This is a 46 year old Caucasian female who presented to the emergency room yesterday afternoon with a 24-hour history of epigastric pain which was sharp and severe and constant and  radiated to the right upper quadrant. She had diarrhea and vomiting. She has never had any jaundice or any liver problems. She states she had some milder similar episodes in the past. She was evaluated by the emergency department staff and a CT scan was done showing  a 2.4 cm low density lesion was noted laterally and the posterior segment of the right lobe of the liver. The gallbladder was distended with a large stone in the gallbladder neck measuring 3 cm diameter. White blood cell count was 21,000. She was admitted last night by Dr. Andrey Campanile and placed on intravenous antibiotics. This morning she was stable but continued to have pain and tenderness. We decided to proceed with cholecystectomy. We decided that we would try to visualize the liver lesion and if we could not we would simply evaluated with MRI a later date. She was agreeable with this plan  Operative Findings:       The gallbladder was acutely inflamed and markedly thick walled. The cholangiogram was normal, showing normal intrahepatic and extrahepatic biliary anatomy, no filling defects, and no obstruction with good flow of contrast to the duodenum. The liver it appeared enlarged but was normal in appearance. We could not visualize any abnormality of the dome or lateral aspect of the right lobe of the liver. The stomach, duodenum, omentum, otherwise looked  normal.  Procedure in Detail:          Following the induction of general endotracheal anesthesia the patient's abdomen was prepped and draped in a sterile fashion. Surgical time out was performed. 0.5% Marcaine with epinephrine was used as a local infiltration anesthetic. Another dose of antibiotics was given. A vertically oriented incision was made at the lower rim of the umbilicus and an 11 mm Hassan trocar inserted with an open technique.   Pneumoperitoneum was created. Camera was inserted with  visualization and findings as described above. An 11 mm trocar was placed in the subxiphoid region and two 5 mm trocars were placed in the right upper quadrant. The gallbladder was thick-walled, acutely inflamed with exudate on it. We used a suction trocar to evacuate the bile from the gallbladder  and then we could  grasp the gallbladder. We identified the infundibulum and lifted up a little bit. We incised the very thickened peritoneum over the lower part of the infundibulum and using the sucker we dissected the soft tissue away from the cystic duct and cystic artery. We isolated the cystic artery as it went over the wall of the gallbladder, secured it with multiple metal clips and  divided it. We then had a nice window behind the cystic duct. A cholangiogram catheter was inserted into the cystic duct and a cholangiogram was obtained using the C-arm. The cholangiogram was normal as described above. We then removed the cholangiogram catheter, secured the cystic duct with multiple clips and divided it. The gallbladder was dissected from its bed with electrocautery and blunt dissection, placed it in a specimen bag and removed it through the umbilical port. We then went back and spent some time irrigating the area and cauterized the bed of the gallbladder. At the completion of the case there really was no bleeding or bile leak, but the bed of the gallbladder was somewhat raw so we placed a piece of Snow hemostatic sponge.  After 5 minutes I saw no bleeding. All the irrigation fluid was removed. The pneumoperitoneum was released. The trocars were removed. The fascia at the umbilicus was closed with 0 Vicryl sutures and the skin incisions closed with subcuticular sutures of 4-0 Monocryl and Steri-Strips.  Bandages were placed and the patient taken to recovery room in stable condition. EBL 30 cc. Counts correct. Complications none.     Angelia Mould. Derrell Lolling, M.D., FACS General and Minimally Invasive Surgery Breast and Colorectal Surgery  08/12/2011 5:28 PM

## 2011-08-12 NOTE — Anesthesia Preprocedure Evaluation (Signed)
Anesthesia Evaluation  Patient identified by MRN, date of birth, ID band Patient awake    Reviewed: Allergy & Precautions, H&P , NPO status , Patient's Chart, lab work & pertinent test results  Airway Mallampati: II TM Distance: <3 FB Neck ROM: Full    Dental No notable dental hx.    Pulmonary neg pulmonary ROS,  breath sounds clear to auscultation  Pulmonary exam normal       Cardiovascular hypertension, Pt. on medications Rhythm:Regular Rate:Normal     Neuro/Psych negative neurological ROS  negative psych ROS   GI/Hepatic negative GI ROS, Neg liver ROS,   Endo/Other  Morbid obesity  Renal/GU negative Renal ROS  negative genitourinary   Musculoskeletal negative musculoskeletal ROS (+)   Abdominal   Peds negative pediatric ROS (+)  Hematology negative hematology ROS (+)   Anesthesia Other Findings   Reproductive/Obstetrics negative OB ROS                           Anesthesia Physical Anesthesia Plan  ASA: II  Anesthesia Plan: General   Post-op Pain Management:    Induction: Intravenous  Airway Management Planned: Oral ETT  Additional Equipment:   Intra-op Plan:   Post-operative Plan: Extubation in OR  Informed Consent: I have reviewed the patients History and Physical, chart, labs and discussed the procedure including the risks, benefits and alternatives for the proposed anesthesia with the patient or authorized representative who has indicated his/her understanding and acceptance.   Dental advisory given  Plan Discussed with: CRNA  Anesthesia Plan Comments:         Anesthesia Quick Evaluation  

## 2011-08-13 ENCOUNTER — Other Ambulatory Visit (INDEPENDENT_AMBULATORY_CARE_PROVIDER_SITE_OTHER): Payer: Self-pay

## 2011-08-13 ENCOUNTER — Telehealth (INDEPENDENT_AMBULATORY_CARE_PROVIDER_SITE_OTHER): Payer: Self-pay

## 2011-08-13 DIAGNOSIS — R160 Hepatomegaly, not elsewhere classified: Secondary | ICD-10-CM

## 2011-08-13 MED ORDER — HYDROCODONE-ACETAMINOPHEN 5-325 MG PO TABS: 1.0000 | ORAL_TABLET | ORAL | Status: AC | PRN

## 2011-08-13 MED ORDER — ACETAMINOPHEN 325 MG PO TABS: 650.0000 mg | ORAL_TABLET | ORAL | Status: DC | PRN

## 2011-08-13 NOTE — Discharge Summary (Signed)
Physician Discharge Summary  Patient ID: April [name] MRN: [id number] DOB/AGE: [birth year] 46 y.o.  Admit date: 08/11/2011 Discharge date: 08/13/2011  Admission Diagnoses:Acute cholecystitis with cholelithiasis   Discharge Diagnoses: Same                                                                                                                                                                      Principal Problem:  *Acute calculous cholecystitis Active Problems:  HYPERTENSION  Liver lesion, right lobe   PROCEDURES: Laparoscopic cholecystectomy with intraoperative cholangiogram 08/12/11 Dr. Blanchie Serve Course: This is a 46 year old Caucasian female who presented to the emergency room yesterday afternoon with a 24-hour history of epigastric pain which was sharp and severe and constant and radiated to the right upper quadrant. She had diarrhea and vomiting. She has never had any jaundice or any liver problems. She states she had some milder similar episodes in the past. She was evaluated by the emergency department staff and a CT scan was done showing a 2.4 cm low density lesion was noted laterally and the posterior segment of the right lobe of the liver. The gallbladder was distended with a large stone in the gallbladder neck measuring 3 cm diameter. White blood cell count was 21,000. She was admitted last night by Dr. Andrey Campanile and placed on intravenous antibiotics. This morning she was stable but continued to have pain and tenderness. We decided to proceed with cholecystectomy. We decided that we would try to visualize the liver lesion and if we could not we would simply evaluated with MRI a later date. She was agreeable with this plan Pt underwent procedure, and tolerated it well.  She was seen following AM and it was Dr. Jacinto Halim opinion she could go home in the afternoom if she was doing well. Pt instructed.  Follow up: 2 week Dr. Derrell Lolling  Condition on D/C:  imporved      Disposition:   Discharge Orders    Future Appointments: Provider: Department: Dept Phone: Center:   08/26/2011 4:30 PM Ernestene Mention, MD [facility] [phone] None     Medication List  As of 08/13/2011 10:02 AM   TAKE these medications         acetaminophen 325 MG tablet   Commonly known as: TYLENOL   Take 2 tablets (650 mg total) by mouth every 4 (four) hours as needed.      atenolol-chlorthalidone 50-25 MG per tablet   Commonly known as: TENORETIC   TAKE 1 TABLET DAILY      CALTRATE PLUS PO   Take by mouth.      HYDROcodone-acetaminophen 5-325 MG per tablet   Commonly known as: NORCO   Take 1-2 tablets by mouth every 4 (four) hours  as needed.      loratadine 10 MG tablet   Commonly known as: CLARITIN   Take 10 mg by mouth daily.      multivitamin capsule   Take 1 capsule by mouth daily.           Follow-up Information    Follow up with April [name],April Freida Busman, MD.      Follow up with April [name],April Freida Busman, MD.   Contact information:   [address] [place] [address] [phone]       Follow up with Ernestene Mention, MD. Schedule an appointment as soon as possible for a visit in 2 weeks.   Contact information:   [employer] [address], [address] [place] [address] [phone]          Signed: Sherrie George 08/13/2011, 10:02 AM

## 2011-08-13 NOTE — Telephone Encounter (Signed)
Message copied by Joanette Gula on Wed Aug 13, 2011  8:44 AM ------      Message from: Ernestene Mention      Created: Wed Aug 13, 2011  8:03 AM       This patient underwent emergent laparoscopic cholecystectomy yesterday. She is doing well and probably will go home today. She has a density in the right lobe of the liver and will need further evaluation of that. Please schedule her for MRI of the liver to be done just prior to her postop office visit.

## 2011-08-13 NOTE — Discharge Instructions (Signed)
[facility] LAPAROSCOPIC SURGERY: POST OP INSTRUCTIONS Always review your discharge instruction sheet given to you by the facility where your surgery was performed. IF YOU HAVE DISABILITY OR FAMILY LEAVE FORMS, YOU MUST BRING THEM TO THE OFFICE FOR PROCESSING.   DO NOT GIVE THEM TO YOUR DOCTOR.  1. A prescription for pain medication may be given to you upon discharge.  Take your pain medication as prescribed, if needed.  If narcotic pain medicine is not needed, then you may take acetaminophen (Tylenol) or ibuprofen (Advil) as needed. 2. Take your usually prescribed medications unless otherwise directed. 3. If you need a refill on your pain medication, please contact your pharmacy.  They will contact our office to request authorization. Prescriptions will not be filled after 5pm or on week-ends. 4. You should follow a light diet the first few days after arrival home, such as soup and crackers, etc.  Be sure to include lots of fluids daily. 5. Most patients will experience some swelling and bruising in the area of the incisions.  Ice packs will help.  Swelling and bruising can take several days to resolve.  6. It is common to experience some constipation if taking pain medication after surgery.  Increasing fluid intake and taking a stool softener (such as Colace) will usually help or prevent this problem from occurring.  A mild laxative (Milk of Magnesia or Miralax) should be taken according to package instructions if there are no bowel movements after 48 hours. 7. Unless discharge instructions indicate otherwise, you may remove your bandages 24-48 hours after surgery, and you may shower at that time.  You may have steri-strips (small skin tapes) in place directly over the incision.  These strips should be left on the skin for 7-10 days.  If your surgeon used skin glue on the incision, you may shower in 24 hours.  The glue will flake off over the next 2-3 weeks.  Any sutures or  staples will be removed at the office during your follow-up visit. 8. ACTIVITIES:  You may resume regular (light) daily activities beginning the next day--such as daily self-care, walking, climbing stairs--gradually increasing activities as tolerated.  You may have sexual intercourse when it is comfortable.  Refrain from any heavy lifting or straining until approved by your doctor. a. You may drive when you are no longer taking prescription pain medication, you can comfortably wear a seatbelt, and you can safely maneuver your car and apply brakes. b. RETURN TO WORK:  __________________________________________________________ 9. You should see your doctor in the office for a follow-up appointment approximately 2-3 weeks after your surgery.  Make sure that you call for this appointment within a day or two after you arrive home to insure a convenient appointment time. 10. OTHER INSTRUCTIONS: __________________________________________________________________________________________________________________________ __________________________________________________________________________________________________________________________ WHEN TO CALL YOUR DOCTOR: 1. Fever over 101.0 2. Inability to urinate 3. Continued bleeding from incision. 4. Increased pain, redness, or drainage from the incision. 5. Increasing abdominal pain  The clinic staff is available to answer your questions during regular business hours.  Please don't hesitate to call and ask to speak to one of the nurses for clinical concerns.  If you have a medical emergency, go to the nearest emergency room or call 911.  A surgeon from [facility] is always on call at the hospital. [address], [address], [place], [place]  [address] ? [address] [address], [place],    [address] [phone] ? [phone] ? FAX [phone] Web site:   [email or web address] Laparoscopic Cholecystectomy Laparoscopic cholecystectomy is  surgery to remove the gallbladder. The gallbladder is located slightly to the right of center in the abdomen, behind the liver. It is a concentrating and storage sac for the bile produced in the liver. Bile aids in the digestion and absorption of fats. Gallbladder disease (cholecystitis) is an inflammation of your gallbladder. This condition is usually caused by a buildup of gallstones (cholelithiasis) in your gallbladder. Gallstones can block the flow of bile, resulting in inflammation and pain. In severe cases, emergency surgery may be required. When emergency surgery is not required, you will have time to prepare for the procedure. Laparoscopic surgery is an alternative to open surgery. Laparoscopic surgery usually has a shorter recovery time. Your common bile duct may also need to be examined and explored. Your caregiver will discuss this with you if he or she feels this should be done. If stones are found in the common bile duct, they may be removed. LET YOUR CAREGIVER KNOW ABOUT:  Allergies to food or medicine.   Medicines taken, including vitamins, herbs, eyedrops, over-the-counter medicines, and creams.   Use of steroids (by mouth or creams).   Previous problems with anesthetics or numbing medicines.   History of bleeding problems or blood clots.   Previous surgery.   Other health problems, including diabetes and kidney problems.   Possibility of pregnancy, if this applies.  RISKS AND COMPLICATIONS All surgery is associated with risks. Some problems that may occur following this procedure include:  Infection.   Damage to the common bile duct, nerves, arteries, veins, or other internal organs such as the stomach or intestines.   Bleeding.   A stone may remain in the common bile duct.  BEFORE THE PROCEDURE  Do not take aspirin for 3 days prior to surgery or blood thinners for 1 week prior to surgery.   Do not eat or drink anything after midnight the night before surgery.    Let your caregiver know if you develop a cold or other infectious problem prior to surgery.   You should be present 60 minutes before the procedure or as directed.  PROCEDURE  You will be given medicine that makes you sleep (general anesthetic). When you are asleep, your surgeon will make several small cuts (incisions) in your abdomen. One of these incisions is used to insert a small, lighted scope (laparoscope) into the abdomen. The laparoscope helps the surgeon see into your abdomen. Carbon dioxide gas will be pumped into your abdomen. The gas allows more room for the surgeon to perform your surgery. Other operating instruments are inserted through the other incisions. Laparoscopic procedures may not be appropriate when:  There is major scarring from previous surgery.   The gallbladder is extremely inflamed.   There are bleeding disorders or unexpected cirrhosis of the liver.   A pregnancy is near term.   Other conditions make the laparoscopic procedure impossible.  If your surgeon feels it is not safe to continue with a laparoscopic procedure, he or she will perform an open abdominal procedure. In this case, the surgeon will make an incision to open the abdomen. This gives the surgeon a larger view and field to work within. This may allow the surgeon to perform procedures that sometimes cannot be performed with a laparoscope alone. Open surgery has a longer recovery time. AFTER THE PROCEDURE  You will be taken to the recovery area where a nurse will watch and check your progress.   You may be allowed to   go home the same day.   Do not resume physical activities until directed by your caregiver.   You may resume a normal diet and activities as directed.  Document Released: 04/28/2005 Document Revised: 04/17/2011 Document Reviewed: 10/11/2010 [employer] Patient Information 2012 [employer]. 

## 2011-08-13 NOTE — Discharge Summary (Signed)
Agree with discharge summary assessment and plan. I will see this patient back in the office in 2-3 weeks.  April [name]. Derrell Lolling, M.D., [facility] General and Minimally invasive Surgery Breast and Colorectal Surgery Office:   [phone] Pager:   [phone]

## 2011-08-13 NOTE — Progress Notes (Signed)
1 Day Post-Op  Subjective: Feels much better. Much less pain. The bathroom and able to void. No nausea. Beginning to take diet.  Objective: Vital signs in last 24 hours: Temp:  [98.3 F (36.8 C)-102.5 F (39.2 C)] 98.3 F (36.8 C) (04/03 0615) Pulse Rate:  [65-95] 71  (04/03 0615) Resp:  [14-26] 18  (04/03 0615) BP: (98-127)/(56-82) 110/70 mmHg (04/03 0615) SpO2:  [92 %-98 %] 98 % (04/03 0615) Last BM Date: 08/11/11  Intake/Output from previous day: 04/02 0701 - 04/03 0700 In: 2760.4 [P.O.:600; I.V.:1960.4; IV Piggyback:200] Out: 3750 [Urine:3750] Intake/Output this shift:    General appearance: alert. Mental status normal. Spirits good. In no distress. GI: abdomen soft.  Minimal appropriate tenderness. Incisions look fine. Benign exam.  Lab Results:  No results found for this or any previous visit (from the past 24 hour(s)).   Studies/Results: @RISRSLT24 @     . atenolol  50 mg Oral Daily   And  . chlorthalidone  25 mg Oral Daily  . ciprofloxacin  400 mg Intravenous Q12H  . heparin  5,000 Units Subcutaneous Q8H  . pantoprazole (PROTONIX) IV  40 mg Intravenous QHS  . potassium chloride  10 mEq Intravenous Q1 Hr x 5  . DISCONTD: acetaminophen  1,000 mg Intravenous Q6H  . DISCONTD: ciprofloxacin  400 mg Intravenous Q12H     Assessment/Plan: s/p Procedure(s): LAPAROSCOPIC CHOLECYSTECTOMY WITH INTRAOPERATIVE CHOLANGIOGRAM  POD #1 stable Possible discharge this afternoon if she does well with diet and activities. Continue IV antibiotics until discharge, but antibiotics will not be necessary as an outpatient. I discussed diet and activities with her.  If she does go home this afternoon, I will see her back in 3 weeks.  Will schedule MRI of the liver as outpatient.    LOS: 2 days    Samanvitha Germany M. Derrell Lolling, M.D., [facility] General and Minimally invasive Surgery Breast and Colorectal Surgery Office:   [phone] Pager:    [phone]  08/13/2011  . .prob

## 2011-08-13 NOTE — ED Provider Notes (Signed)
Medical screening examination/treatment/procedure(s) were performed by non-physician practitioner and as supervising physician I was immediately available for consultation/collaboration.  CT A/P pending. PA to f/u and dispo accordingly.  Forbes Cellar, MD 08/13/11 1224

## 2011-08-13 NOTE — Progress Notes (Signed)
Patient provided with discharge instructions and prescription. Patient verbalized understanding. Patient discharged to home. 

## 2011-08-18 ENCOUNTER — Telehealth (INDEPENDENT_AMBULATORY_CARE_PROVIDER_SITE_OTHER): Payer: Self-pay | Admitting: General Surgery

## 2011-08-18 NOTE — Telephone Encounter (Signed)
I left msg for pt NPO 6hr prior to test and that I did not know how long she may be at hospital for test. It depends on their schedule.

## 2011-08-21 ENCOUNTER — Encounter (HOSPITAL_COMMUNITY): Payer: Self-pay | Admitting: General Surgery

## 2011-08-21 ENCOUNTER — Ambulatory Visit (HOSPITAL_COMMUNITY)
Admission: RE | Admit: 2011-08-21 | Discharge: 2011-08-21 | Disposition: A | Discharge status: Home / Self Care | Payer: 59 | Source: Ambulatory Visit | Attending: General Surgery | Admitting: General Surgery

## 2011-08-21 DIAGNOSIS — R160 Hepatomegaly, not elsewhere classified: Secondary | ICD-10-CM

## 2011-08-21 DIAGNOSIS — D1803 Hemangioma of intra-abdominal structures: Secondary | ICD-10-CM | POA: Insufficient documentation

## 2011-08-21 DIAGNOSIS — K7689 Other specified diseases of liver: Secondary | ICD-10-CM | POA: Insufficient documentation

## 2011-08-21 DIAGNOSIS — Z9089 Acquired absence of other organs: Secondary | ICD-10-CM | POA: Insufficient documentation

## 2011-08-21 MED ORDER — GADOBENATE DIMEGLUMINE 529 MG/ML IV SOLN
20.0000 mL | Freq: Once | INTRAVENOUS | Status: AC | PRN
  Administered 2011-08-21: 20 mL via INTRAVENOUS

## 2011-08-22 ENCOUNTER — Telehealth (INDEPENDENT_AMBULATORY_CARE_PROVIDER_SITE_OTHER): Payer: Self-pay

## 2011-08-22 NOTE — Telephone Encounter (Signed)
Pt notified of MRI result. Pt to keep f/u appt.

## 2011-08-22 NOTE — Telephone Encounter (Signed)
LMOM for pt to call for MRI result.

## 2011-08-26 ENCOUNTER — Ambulatory Visit (INDEPENDENT_AMBULATORY_CARE_PROVIDER_SITE_OTHER): Payer: 59 | Admitting: General Surgery

## 2011-08-26 ENCOUNTER — Encounter (INDEPENDENT_AMBULATORY_CARE_PROVIDER_SITE_OTHER): Payer: Self-pay | Admitting: General Surgery

## 2011-08-26 VITALS — BP 148/84 | HR 88 | Temp 97.9°F | Resp 18 | Ht 69.0 in | Wt 242.6 lb

## 2011-08-26 DIAGNOSIS — K8000 Calculus of gallbladder with acute cholecystitis without obstruction: Secondary | ICD-10-CM

## 2011-08-26 NOTE — Progress Notes (Signed)
Subjective:     Patient ID: April [name], female   DOB: 1965-09-30, 46 y.o.   MRN: [id number]  HPI  This patient underwent emergent laparoscopic cholecystectomy with cholangiogram on April 2. She had acute cholecystitis with cholelithiasis. She also had a preop CT scan which showed a 2 cm density in the right lobe of the liver. This could not be visualized at the time of surgery.  She has recovered from the gallbladder surgery. She had diarrhea for one week but that has now resolved. She has no pain or wound problems. She has gone back to work. She wants to go back to exercise.  We did perform an MRI of the liver and that shows a classic hemangioma in the right lobe, subcapsular area, 2.6 cm in diameter. I gave her a copy of the report and discussed the diagnosis with her and her mother. Review of Systems     Objective:   Physical Exam Patient looks well. No distress. BMI 35.9.  Abdomen soft. Nontender. All of the trocar sites are well healed. Benign exam.    Assessment:     Acute cholecystitis with cholelithiasis, uneventful recovery following laparoscopic cholecystectomy with cholangiogram.  2.6 cm hemangioma, subcapsular region, a right lobe liver. No further intervention warranted at this time.    Plan:     Okay to resume all normal activities, including exercise, without restriction.  Low-fat diet  Advised repeat MRI in one year and reviewed findings with her primary care physician, Dr. Kelle Darting.  Return to see me p.r.n.   Angelia Mould. Derrell Lolling, M.D., [facility] General and Minimally invasive Surgery Breast and Colorectal Surgery Office:   [phone] Pager:   [phone]

## 2011-08-26 NOTE — Patient Instructions (Signed)
You have recovered from your gallbladder surgery without any obvious complication. You may return to exercise and work without restriction. Follow a low-fat diet forever.  The MRI of your liver shows a classic, benign hemangioma in the right lobe. Nothing further needs to be done at this time. You have been given a report of this x-ray. I recommend that you repeat the MRI in one year and discuss that with your primary care physician, Dr. Tawanna Cooler.  Return to Dr. Derrell Lolling if further problems arise.   Laparoscopic Cholecystectomy Laparoscopic cholecystectomy is surgery to remove the gallbladder. The gallbladder is located slightly to the right of center in the abdomen, behind the liver. It is a concentrating and storage sac for the bile produced in the liver. Bile aids in the digestion and absorption of fats. Gallbladder disease (cholecystitis) is an inflammation of your gallbladder. This condition is usually caused by a buildup of gallstones (cholelithiasis) in your gallbladder. Gallstones can block the flow of bile, resulting in inflammation and pain. In severe cases, emergency surgery may be required. When emergency surgery is not required, you will have time to prepare for the procedure. Laparoscopic surgery is an alternative to open surgery. Laparoscopic surgery usually has a shorter recovery time. Your common bile duct may also need to be examined and explored. Your caregiver will discuss this with you if he or she feels this should be done. If stones are found in the common bile duct, they may be removed. LET YOUR CAREGIVER KNOW ABOUT:  Allergies to food or medicine.   Medicines taken, including vitamins, herbs, eyedrops, over-the-counter medicines, and creams.   Use of steroids (by mouth or creams).   Previous problems with anesthetics or numbing medicines.   History of bleeding problems or blood clots.   Previous surgery.   Other health problems, including diabetes and kidney problems.    Possibility of pregnancy, if this applies.  RISKS AND COMPLICATIONS All surgery is associated with risks. Some problems that may occur following this procedure include:  Infection.   Damage to the common bile duct, nerves, arteries, veins, or other internal organs such as the stomach or intestines.   Bleeding.   A stone may remain in the common bile duct.  BEFORE THE PROCEDURE  Do not take aspirin for 3 days prior to surgery or blood thinners for 1 week prior to surgery.   Do not eat or drink anything after midnight the night before surgery.   Let your caregiver know if you develop a cold or other infectious problem prior to surgery.   You should be present 60 minutes before the procedure or as directed.  PROCEDURE  You will be given medicine that makes you sleep (general anesthetic). When you are asleep, your surgeon will make several small cuts (incisions) in your abdomen. One of these incisions is used to insert a small, lighted scope (laparoscope) into the abdomen. The laparoscope helps the surgeon see into your abdomen. Carbon dioxide gas will be pumped into your abdomen. The gas allows more room for the surgeon to perform your surgery. Other operating instruments are inserted through the other incisions. Laparoscopic procedures may not be appropriate when:  There is major scarring from previous surgery.   The gallbladder is extremely inflamed.   There are bleeding disorders or unexpected cirrhosis of the liver.   A pregnancy is near term.   Other conditions make the laparoscopic procedure impossible.  If your surgeon feels it is not safe to continue with a laparoscopic  procedure, he or she will perform an open abdominal procedure. In this case, the surgeon will make an incision to open the abdomen. This gives the surgeon a larger view and field to work within. This may allow the surgeon to perform procedures that sometimes cannot be performed with a laparoscope alone. Open  surgery has a longer recovery time. AFTER THE PROCEDURE  You will be taken to the recovery area where a nurse will watch and check your progress.   You may be allowed to go home the same day.   Do not resume physical activities until directed by your caregiver.   You may resume a normal diet and activities as directed.  Document Released: 04/28/2005 Document Revised: 04/17/2011 Document Reviewed: 10/11/2010 [facility] Patient Information 2012 [place], [place].

## 2011-12-30 ENCOUNTER — Ambulatory Visit (INDEPENDENT_AMBULATORY_CARE_PROVIDER_SITE_OTHER): Payer: 59 | Admitting: General Surgery

## 2011-12-30 ENCOUNTER — Encounter (INDEPENDENT_AMBULATORY_CARE_PROVIDER_SITE_OTHER): Payer: Self-pay | Admitting: General Surgery

## 2011-12-30 VITALS — BP 112/72 | HR 76 | Temp 97.4°F | Resp 16 | Ht 70.0 in | Wt 241.2 lb

## 2011-12-30 DIAGNOSIS — K432 Incisional hernia without obstruction or gangrene: Secondary | ICD-10-CM

## 2011-12-30 NOTE — Progress Notes (Signed)
Patient ID: April [name], female   DOB: [birth year], 46 y.o.   MRN: [id number]  Chief Complaint  Patient presents with  . Other    soft protusion from umbilicus    HPI April [name] is a 46 y.o. female.  She is self referred back to me because of some discomfort at her umbilicus.  This patient underwent emergency laparoscopic cholecystectomy with cholangiogram on April 2. She has severe acute cholecystitis with cholelithiasis. Postop visit April 16 she was doing well. She has returned to full activities. She has been going to the gym for the past 2 months.  She now reports a one-week history of an aching discomfort in her umbilicus and thinks she feels a little bulge. No change in her bowel habits. HPI  Past Medical History  Diagnosis Date  . Bite from cat     secondary infection right hand sugery 10/08  . Hypertension     Past Surgical History  Procedure Date  . Tonsillectomy   . Pilonidal cyst / sinus excision   . Hand surgery     Cat Bite trauma  . Mirena     inserted 05-03-09  . Cholecystectomy 08/12/2011    Procedure: LAPAROSCOPIC CHOLECYSTECTOMY WITH INTRAOPERATIVE CHOLANGIOGRAM;  Surgeon: Ernestene Mention, MD;  Location: [facility];  Service: General;  Laterality: N/A;    Family History  Problem Relation Age of Onset  . Heart disease Mother   . Diabetes Father   . Colon cancer Father   . Breast cancer Maternal Grandmother   . Hypertension Paternal Grandmother     Social History History  Substance Use Topics  . Smoking status: Former Games developer  . Smokeless tobacco: Not on file  . Alcohol Use: Yes     rare    Allergies  Allergen Reactions  . Amoxicillin     REACTION: hives  . Penicillins     REACTION: hives    Current Outpatient Prescriptions  Medication Sig Dispense Refill  . acetaminophen (TYLENOL) 325 MG tablet Take 2 tablets (650 mg total) by mouth every 4 (four) hours as needed.      Marland Kitchen atenolol-chlorthalidone (TENORETIC) 50-25 MG per tablet TAKE 1 TABLET  DAILY  100 tablet  2  . Calcium Carbonate-Vit D-Min (CALTRATE PLUS PO) Take by mouth.        . loratadine (CLARITIN) 10 MG tablet Take 10 mg by mouth daily.        . Multiple Vitamin (MULTIVITAMIN) capsule Take 1 capsule by mouth daily.          Review of Systems Review of Systems  Constitutional: Negative for fever, chills and unexpected weight change.  HENT: Negative for hearing loss, congestion, sore throat, trouble swallowing and voice change.   Eyes: Negative for visual disturbance.  Respiratory: Negative for cough, wheezing and stridor.   Cardiovascular: Negative for chest pain, palpitations and leg swelling.  Gastrointestinal: Positive for abdominal pain. Negative for nausea, vomiting, diarrhea, constipation, blood in stool, abdominal distention and anal bleeding.  Genitourinary: Negative for hematuria, vaginal bleeding and difficulty urinating.  Musculoskeletal: Negative for arthralgias.  Skin: Negative for rash and wound.  Neurological: Negative for seizures, syncope and headaches.  Hematological: Negative for adenopathy. Does not bruise/bleed easily.  Psychiatric/Behavioral: Negative for confusion.    Blood pressure 112/72, pulse 76, temperature 97.4 F (36.3 C), temperature source Temporal, resp. rate 16, height 5\' 10"  (1.778 m), weight 241 lb 3.2 oz (109.408 kg).  Physical Exam Physical Exam  Constitutional: She is oriented  to person, place, and time. She appears well-developed and well-nourished. No distress.  HENT:  Head: Normocephalic and atraumatic.  Cardiovascular: Normal rate, regular rhythm, normal heart sounds and intact distal pulses.   No murmur heard. Pulmonary/Chest: Effort normal and breath sounds normal. No respiratory distress. She has no wheezes. She has no rales. She exhibits no tenderness.  Abdominal: Soft. Bowel sounds are normal. She exhibits no distension and no mass. There is tenderness. There is no rebound and no guarding.       Infraumbilical  incision from umbilical trocar site well healed. There is a slight bulge suggesting a very early hernia, but I cannot feel a defect.  Musculoskeletal: She exhibits no edema and no tenderness.  Neurological: She is alert and oriented to person, place, and time.  Skin: Skin is warm. No rash noted. She is not diaphoretic. No erythema. No pallor.  Psychiatric: She has a normal mood and affect. Her behavior is normal. Judgment and thought content normal.    Data Reviewed  Assessment    Umbilical pain. She is possibly developing a very small incisional hernia.  Obesity  Hypertension  Status post laparoscopic cholecystectomy, April, 2013    Plan    She was advised that she may be developing a small hernia. I told her that the options at this point were to wait and see or to proceed with an open repair with mesh.  She states that this is not a good time and wants to wait and see. This is reasonable.  She will return to see me in 10 weeks to see if if there has been progression, sooner if symptoms progress.       Adelfa Lozito M 12/30/2011, 5:25 PM

## 2011-12-30 NOTE — Patient Instructions (Signed)
You may be developing a very tiny hernia in your umbilical incision. There is no immediate danger to this.  We discussed the different options, and we have decided that you will return to see me in 10 weeks to see if this has progressed or not. you will call me sooner if you are having any more problems.

## 2012-01-26 ENCOUNTER — Encounter: Payer: Self-pay | Admitting: Gastroenterology

## 2012-01-26 ENCOUNTER — Other Ambulatory Visit: Payer: Self-pay | Admitting: Obstetrics and Gynecology

## 2012-01-26 DIAGNOSIS — Z1231 Encounter for screening mammogram for malignant neoplasm of breast: Secondary | ICD-10-CM

## 2012-02-02 ENCOUNTER — Ambulatory Visit
Admission: RE | Admit: 2012-02-02 | Discharge: 2012-02-02 | Disposition: A | Discharge status: Home / Self Care | Payer: 59 | Source: Ambulatory Visit | Attending: Obstetrics and Gynecology | Admitting: Obstetrics and Gynecology

## 2012-02-02 DIAGNOSIS — Z1231 Encounter for screening mammogram for malignant neoplasm of breast: Secondary | ICD-10-CM

## 2012-03-03 ENCOUNTER — Other Ambulatory Visit: Payer: Self-pay | Admitting: Family Medicine

## 2012-03-09 ENCOUNTER — Ambulatory Visit (INDEPENDENT_AMBULATORY_CARE_PROVIDER_SITE_OTHER): Payer: 59 | Admitting: General Surgery

## 2012-03-09 ENCOUNTER — Encounter (INDEPENDENT_AMBULATORY_CARE_PROVIDER_SITE_OTHER): Payer: Self-pay | Admitting: General Surgery

## 2012-03-09 VITALS — BP 120/80 | HR 92 | Temp 97.1°F | Resp 20 | Ht 70.0 in | Wt 248.4 lb

## 2012-03-09 DIAGNOSIS — K8000 Calculus of gallbladder with acute cholecystitis without obstruction: Secondary | ICD-10-CM

## 2012-03-09 DIAGNOSIS — K432 Incisional hernia without obstruction or gangrene: Secondary | ICD-10-CM

## 2012-03-09 NOTE — Patient Instructions (Signed)
Your small incisional hernia at the umbilicus has gotten a little bit bigger. He had decided to go and have this repaired before the end of the year.  You'll be scheduled for open repair of your incisional hernia with mesh in the near future.    Hernia A hernia occurs when an internal organ pushes out through a weak spot in the abdominal wall. Hernias most commonly occur in the groin and around the navel. Hernias often can be pushed back into place (reduced). Most hernias tend to get worse over time. Some abdominal hernias can get stuck in the opening (irreducible or incarcerated hernia) and cannot be reduced. An irreducible abdominal hernia which is tightly squeezed into the opening is at risk for impaired blood supply (strangulated hernia). A strangulated hernia is a medical emergency. Because of the risk for an irreducible or strangulated hernia, surgery may be recommended to repair a hernia. CAUSES   Heavy lifting.  Prolonged coughing.  Straining to have a bowel movement.  A cut (incision) made during an abdominal surgery. HOME CARE INSTRUCTIONS   Bed rest is not required. You may continue your normal activities.  Avoid lifting more than 10 pounds (4.5 kg) or straining.  Cough gently. If you are a smoker it is best to stop. Even the best hernia repair can break down with the continual strain of coughing. Even if you do not have your hernia repaired, a cough will continue to aggravate the problem.  Do not wear anything tight over your hernia. Do not try to keep it in with an outside bandage or truss. These can damage abdominal contents if they are trapped within the hernia sac.  Eat a normal diet.  Avoid constipation. Straining over long periods of time will increase hernia size and encourage breakdown of repairs. If you cannot do this with diet alone, stool softeners may be used. SEEK IMMEDIATE MEDICAL CARE IF:   You have a fever.  You develop increasing abdominal pain.  You  feel nauseous or vomit.  Your hernia is stuck outside the abdomen, looks discolored, feels hard, or is tender.  You have any changes in your bowel habits or in the hernia that are unusual for you.  You have increased pain or swelling around the hernia.  You cannot push the hernia back in place by applying gentle pressure while lying down. MAKE SURE YOU:   Understand these instructions.  Will watch your condition.  Will get help right away if you are not doing well or get worse. Document Released: 04/28/2005 Document Revised: 07/21/2011 Document Reviewed: 12/16/2007 [facility] Patient Information 2013 [place], [place].

## 2012-03-09 NOTE — Progress Notes (Signed)
Patient ID: April [name], female   DOB: 06/11/1965, 46 y.o.   MRN: [id number]  Chief Complaint  Patient presents with  . Routine Post Op    PO lap chole, incision site, unb reck    HPI April [name] is a 46 y.o. female.  She returns stating that she thinks she was to go ahead and have her hernia repair.  This patient underwent emergency laparoscopic cholecystectomy for acute cholecystitis on 08/12/2011. I saw her in the office on August 20 of this year this time because she was developing a small hernia.  It has gotten a little bit bigger not much more painful but is interested in talking about having this repaired before the end of the year.  Past history reveals hypertension, obesity, otherwise reasonably healthy young woman.  April [name] is scheduled for colonoscopy on November 22 because her father had colon cancer. HPI  Past Medical History  Diagnosis Date  . Bite from cat     secondary infection right hand sugery 10/08  . Hypertension     Past Surgical History  Procedure Date  . Tonsillectomy   . Pilonidal cyst / sinus excision   . Hand surgery     Cat Bite trauma  . Mirena     inserted 05-03-09  . Cholecystectomy 08/12/2011    Procedure: LAPAROSCOPIC CHOLECYSTECTOMY WITH INTRAOPERATIVE CHOLANGIOGRAM;  Surgeon: Ernestene Mention, MD;  Location: [facility];  Service: General;  Laterality: N/A;    Family History  Problem Relation Age of Onset  . Heart disease Mother   . Diabetes Father   . Colon cancer Father   . Breast cancer Maternal Grandmother   . Hypertension Paternal Grandmother     Social History History  Substance Use Topics  . Smoking status: Former Games developer  . Smokeless tobacco: Not on file  . Alcohol Use: Yes     rare    Allergies  Allergen Reactions  . Amoxicillin     REACTION: hives  . Penicillins     REACTION: hives    Current Outpatient Prescriptions  Medication Sig Dispense Refill  . acetaminophen (TYLENOL) 325 MG tablet Take 2 tablets (650 mg total)  by mouth every 4 (four) hours as needed.      Marland Kitchen atenolol-chlorthalidone (TENORETIC) 50-25 MG per tablet TAKE 1 TABLET DAILY  90 tablet  0  . Calcium Carbonate-Vit D-Min (CALTRATE PLUS PO) Take by mouth.        . loratadine (CLARITIN) 10 MG tablet Take 10 mg by mouth daily.        . Multiple Vitamin (MULTIVITAMIN) capsule Take 1 capsule by mouth daily.          Review of Systems Review of Systems  Constitutional: Negative for fever, chills and unexpected weight change.  HENT: Negative for hearing loss, congestion, sore throat, trouble swallowing and voice change.   Eyes: Negative for visual disturbance.  Respiratory: Negative for cough and wheezing.   Cardiovascular: Negative for chest pain, palpitations and leg swelling.  Gastrointestinal: Positive for abdominal pain. Negative for nausea, vomiting, diarrhea, constipation, blood in stool, abdominal distention and anal bleeding.  Genitourinary: Negative for hematuria, vaginal bleeding and difficulty urinating.  Musculoskeletal: Negative for arthralgias.  Skin: Negative for rash and wound.  Neurological: Negative for seizures, syncope and headaches.  Hematological: Negative for adenopathy. Does not bruise/bleed easily.  Psychiatric/Behavioral: Negative for confusion.    Blood pressure 120/80, pulse 92, temperature 97.1 F (36.2 C), temperature source Temporal, resp. rate 20, height 5\' 10"  (1.778  m), weight 248 lb 6.4 oz (112.674 kg).  Physical Exam Physical Exam  Constitutional: She is oriented to person, place, and time. She appears well-developed and well-nourished. No distress.  HENT:  Mouth/Throat: No oropharyngeal exudate.  Neck: Neck supple. No JVD present. No tracheal deviation present. No thyromegaly present.  Cardiovascular: Normal rate, regular rhythm, normal heart sounds and intact distal pulses.   No murmur heard. Pulmonary/Chest: Effort normal and breath sounds normal. No respiratory distress. She has no wheezes. She has  no rales. She exhibits no tenderness.  Abdominal: Soft. Bowel sounds are normal. She exhibits no distension and no mass. There is no tenderness. There is no rebound and no guarding.       Small incarcerated hernia in the umbilical trocar site. Could not completely reduced. Skin otherwise healthy. Obese but soft and nontender otherwise.  Lymphadenopathy:    She has no cervical adenopathy.  Neurological: She is alert and oriented to person, place, and time. She exhibits normal muscle tone. Coordination normal.  Skin: Skin is warm. No rash noted. She is not diaphoretic. No erythema. No pallor.  Psychiatric: She has a normal mood and affect. Her behavior is normal. Judgment and thought content normal.    Data Reviewed Old chart  Assessment    Incarcerated umbilical incisional hernia  History of emergency laparoscopic cholecystectomy  Hypertension  Mild obesity    Plan    Scheduled for elective repair of incisional hernia with mesh. Because this is relatively small I think we can do this with a transverse infraumbilical incision and inlay disc mesh.  I discussed the indications, and details, techniques, and numerous risks of the surgery with the patient. She's been given written information and we have discussed diagrams about the technique.  We talked about temporary disability issues. She understands these issues. Her questions were answered. She agrees with this plan.      Angelia Mould. Derrell Lolling, M.D., [facility] General and Minimally invasive Surgery Breast and Colorectal Surgery Office:   [phone] Pager:   [phone]   03/09/2012, 5:22 PM

## 2012-03-19 ENCOUNTER — Ambulatory Visit (AMBULATORY_SURGERY_CENTER): Payer: 59

## 2012-03-19 VITALS — Ht 70.0 in | Wt 241.0 lb

## 2012-03-19 DIAGNOSIS — Z1211 Encounter for screening for malignant neoplasm of colon: Secondary | ICD-10-CM

## 2012-03-19 MED ORDER — MOVIPREP 100 G PO SOLR: 1.0000 | Freq: Once | ORAL | Status: DC

## 2012-04-02 ENCOUNTER — Encounter: Payer: Self-pay | Admitting: Gastroenterology

## 2012-04-02 ENCOUNTER — Ambulatory Visit (AMBULATORY_SURGERY_CENTER): Payer: 59 | Admitting: Gastroenterology

## 2012-04-02 VITALS — BP 142/95 | HR 58 | Temp 96.6°F | Resp 19 | Ht 70.0 in | Wt 241.0 lb

## 2012-04-02 DIAGNOSIS — D126 Benign neoplasm of colon, unspecified: Secondary | ICD-10-CM

## 2012-04-02 DIAGNOSIS — Z800 Family history of malignant neoplasm of digestive organs: Secondary | ICD-10-CM

## 2012-04-02 DIAGNOSIS — Z1211 Encounter for screening for malignant neoplasm of colon: Secondary | ICD-10-CM

## 2012-04-02 HISTORY — PX: COLONOSCOPY: SHX174

## 2012-04-02 MED ORDER — SODIUM CHLORIDE 0.9 % IV SOLN: 500.0000 mL | INTRAVENOUS | Status: DC

## 2012-04-02 NOTE — Progress Notes (Signed)
Patient did not experience any of the following events: a burn prior to discharge; a fall within the facility; wrong site/side/patient/procedure/implant event; or a hospital transfer or hospital admission upon discharge from the facility. (G8907) Patient did not have preoperative order for IV antibiotic SSI prophylaxis. (G8918)  

## 2012-04-02 NOTE — Patient Instructions (Signed)
AVOID ASPIRIN PRODUCTS AND ANTIINFLAMATORIES FOR 2 WEEKS, December 6,2013.   YOU HAD AN ENDOSCOPIC PROCEDURE TODAY AT THE  ENDOSCOPY CENTER: Refer to the procedure report that was given to you for any specific questions about what was found during the examination.  If the procedure report does not answer your questions, please call your gastroenterologist to clarify.  If you requested that your care partner not be given the details of your procedure findings, then the procedure report has been included in a sealed envelope for you to review at your convenience later.  YOU SHOULD EXPECT: Some feelings of bloating in the abdomen. Passage of more gas than usual.  Walking can help get rid of the air that was put into your GI tract during the procedure and reduce the bloating. If you had a lower endoscopy (such as a colonoscopy or flexible sigmoidoscopy) you may notice spotting of blood in your stool or on the toilet paper. If you underwent a bowel prep for your procedure, then you may not have a normal bowel movement for a few days.  DIET: Your first meal following the procedure should be a light meal and then it is ok to progress to your normal diet.  A half-sandwich or bowl of soup is an example of a good first meal.  Heavy or fried foods are harder to digest and may make you feel nauseous or bloated.  Likewise meals heavy in dairy and vegetables can cause extra gas to form and this can also increase the bloating.  Drink plenty of fluids but you should avoid alcoholic beverages for 24 hours.  ACTIVITY: Your care partner should take you home directly after the procedure.  You should plan to take it easy, moving slowly for the rest of the day.  You can resume normal activity the day after the procedure however you should NOT DRIVE or use heavy machinery for 24 hours (because of the sedation medicines used during the test).    SYMPTOMS TO REPORT IMMEDIATELY: A gastroenterologist can be reached at any  hour.  During normal business hours, 8:30 AM to 5:00 PM Monday through Friday, call [phone].  After hours and on weekends, please call the GI answering service at [phone] who will take a message and have the physician on call contact you.   Following lower endoscopy (colonoscopy or flexible sigmoidoscopy):  Excessive amounts of blood in the stool  Significant tenderness or worsening of abdominal pains  Swelling of the abdomen that is new, acute  Fever of 100F or higher   FOLLOW UP: If any biopsies were taken you will be contacted by phone or by letter within the next 1-3 weeks.  Call your gastroenterologist if you have not heard about the biopsies in 3 weeks.  Our staff will call the home number listed on your records the next business day following your procedure to check on you and address any questions or concerns that you may have at that time regarding the information given to you following your procedure. This is a courtesy call and so if there is no answer at the home number and we have not heard from you through the emergency physician on call, we will assume that you have returned to your regular daily activities without incident.  SIGNATURES/CONFIDENTIALITY: You and/or your care partner have signed paperwork which will be entered into your electronic medical record.  These signatures attest to the fact that that the information above on your After Visit Summary has  been reviewed and is understood.  Full responsibility of the confidentiality of this discharge information lies with you and/or your care-partner.  

## 2012-04-02 NOTE — Progress Notes (Signed)
1031 A/OX3 PLEASED REPORT TO kAREN RN

## 2012-04-02 NOTE — Op Note (Signed)
[facility] [address] [place], [address]   COLONOSCOPY PROCEDURE REPORT  PATIENT: April [name], April [name]  MR#: [id number] BIRTHDATE: 05/08/1966 , 45  yrs. old GENDER: Female ENDOSCOPIST: Meryl Dare, MD, [facility] PROCEDURE DATE:  04/02/2012 PROCEDURE:   Colonoscopy with snare polypectomy ASA CLASS:   Class II INDICATIONS:elevated risk screening and patient's immediate family history of colon cancer. MEDICATIONS: MAC sedation, administered by CRNA and propofol (Diprivan) 260mg  IV DESCRIPTION OF PROCEDURE:   After the risks benefits and alternatives of the procedure were thoroughly explained, informed consent was obtained.  A digital rectal exam revealed no abnormalities of the rectum.   The LB CF-[id number]  endoscope was introduced through the anus and advanced to the cecum, which was identified by both the appendix and ileocecal valve. No adverse events experienced.   The quality of the prep was excellent, using MoviPrep  The instrument was then slowly withdrawn as the colon was fully examined.    COLON FINDINGS: A sessile polyp measuring 8 mm in size was found in the ascending colon.  A polypectomy was performed with a cold snare.  The resection was complete and the polyp tissue was completely retrieved.   The colon was otherwise normal.  There was no diverticulosis, inflammation, polyps or cancers unless previously stated.  Retroflexed views revealed no abnormalities. The time to cecum=1 minutes 42 seconds.  Withdrawal time=12 minutes 50 seconds.  The scope was withdrawn and the procedure completed.  COMPLICATIONS: There were no complications.  ENDOSCOPIC IMPRESSION: 1.   Sessile polyp measuring 8 mm in the ascending colon; polypectomy performed with a cold snare 2.   The colon was otherwise normal  RECOMMENDATIONS: 1.  Await pathology results 2.  Hold aspirin, aspirin products, and anti-inflammatory medication for 2 weeks. 3.  Repeat Colonoscopy in 5  years.   eSigned:  Meryl Dare, MD, [facility] 04/02/2012 10:28 AM

## 2012-04-05 ENCOUNTER — Telehealth: Payer: Self-pay | Admitting: *Deleted

## 2012-04-05 NOTE — Telephone Encounter (Signed)
  Follow up Call-  Call back number 04/02/2012  Post procedure Call Back phone  # [phone]  Permission to leave phone message Yes     Patient questions:  Message left to call if necessary.

## 2012-04-11 ENCOUNTER — Encounter: Payer: Self-pay | Admitting: Gastroenterology

## 2012-04-19 ENCOUNTER — Ambulatory Visit (INDEPENDENT_AMBULATORY_CARE_PROVIDER_SITE_OTHER): Payer: 59 | Admitting: Obstetrics and Gynecology

## 2012-04-19 ENCOUNTER — Encounter: Payer: Self-pay | Admitting: Obstetrics and Gynecology

## 2012-04-19 VITALS — BP 124/78 | Ht 68.5 in | Wt 244.0 lb

## 2012-04-19 DIAGNOSIS — Z01419 Encounter for gynecological examination (general) (routine) without abnormal findings: Secondary | ICD-10-CM

## 2012-04-19 DIAGNOSIS — K469 Unspecified abdominal hernia without obstruction or gangrene: Secondary | ICD-10-CM | POA: Insufficient documentation

## 2012-04-19 DIAGNOSIS — N938 Other specified abnormal uterine and vaginal bleeding: Secondary | ICD-10-CM

## 2012-04-19 DIAGNOSIS — N949 Unspecified condition associated with female genital organs and menstrual cycle: Secondary | ICD-10-CM

## 2012-04-19 DIAGNOSIS — N925 Other specified irregular menstruation: Secondary | ICD-10-CM

## 2012-04-19 NOTE — Patient Instructions (Signed)
Return for pelvic ultrasound.

## 2012-04-19 NOTE — Addendum Note (Signed)
Addended by: Dayna Barker on: 04/19/2012 05:10 PM   Modules accepted: Orders

## 2012-04-19 NOTE — Progress Notes (Signed)
Patient came to see me today for her annual GYN exam. In December, 2010 we placed a Mirena IUD so she wouldn't have to take birth control pills anymore. She is a nonsmoker but is on blood pressure medication. For 2-1/2 years she just had a light cycle monthly. For the last 4 months she's had persistent vaginal bleeding almost daily. For the past month she's had severe menstrual cramping. Last year she had a normal ultrasound. 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 She has had normal Pap smears since then. Her last Pap smear was 2012. She does her lab through her PCP. She is up-to-date on mammograms.  Physical examination:April [name] present. HEENT within normal limits. Neck: Thyroid not large. No masses. Supraclavicular nodes: not enlarged. Breasts: Examined in both sitting and lying  position. No skin changes and no masses. Abdomen: Soft no guarding rebound or masses or hernia. Pelvic: External: Within normal limits. BUS: Within normal limits. Vaginal:within normal limits. Good estrogen effect. No evidence of cystocele rectocele or enterocele. Cervix: clean.IUD string visible.  Uterus: Normal size and shape. Adnexa: No masses. Rectovaginal exam: Confirmatory and negative. Extremities: Within normal limits.  Assessment: Dysfunctional uterine bleeding with menstrual cramping. History of CIN.  Plan: Scheduled ultrasound was time to do SIADH if appropriate. Pap not done.The new Pap smear guidelines were discussed with the patient.

## 2012-04-19 NOTE — Addendum Note (Signed)
Addended by: Dayna Barker on: 04/19/2012 05:04 PM   Modules accepted: Orders

## 2012-04-20 ENCOUNTER — Encounter (HOSPITAL_COMMUNITY): Payer: Self-pay | Admitting: Pharmacy Technician

## 2012-04-20 LAB — URINALYSIS W MICROSCOPIC + REFLEX CULTURE
Casts: NONE SEEN
Glucose, UA: NEGATIVE mg/dL
Hgb urine dipstick: NEGATIVE
Leukocytes, UA: NEGATIVE
Squamous Epithelial / LPF: NONE SEEN
pH: 7 (ref 5.0–8.0)

## 2012-04-21 ENCOUNTER — Ambulatory Visit (INDEPENDENT_AMBULATORY_CARE_PROVIDER_SITE_OTHER): Payer: 59 | Admitting: Obstetrics and Gynecology

## 2012-04-21 ENCOUNTER — Ambulatory Visit (INDEPENDENT_AMBULATORY_CARE_PROVIDER_SITE_OTHER): Payer: 59

## 2012-04-21 ENCOUNTER — Telehealth: Payer: Self-pay | Admitting: Obstetrics and Gynecology

## 2012-04-21 ENCOUNTER — Other Ambulatory Visit: Payer: Self-pay | Admitting: Obstetrics and Gynecology

## 2012-04-21 DIAGNOSIS — N938 Other specified abnormal uterine and vaginal bleeding: Secondary | ICD-10-CM

## 2012-04-21 DIAGNOSIS — N949 Unspecified condition associated with female genital organs and menstrual cycle: Secondary | ICD-10-CM

## 2012-04-21 DIAGNOSIS — N923 Ovulation bleeding: Secondary | ICD-10-CM

## 2012-04-21 DIAGNOSIS — N921 Excessive and frequent menstruation with irregular cycle: Secondary | ICD-10-CM

## 2012-04-21 DIAGNOSIS — N840 Polyp of corpus uteri: Secondary | ICD-10-CM

## 2012-04-21 DIAGNOSIS — N8300 Follicular cyst of ovary, unspecified side: Secondary | ICD-10-CM

## 2012-04-21 DIAGNOSIS — N925 Other specified irregular menstruation: Secondary | ICD-10-CM

## 2012-04-21 MED ORDER — DOXYCYCLINE HYCLATE 50 MG PO CAPS: 100.0000 mg | ORAL_CAPSULE | Freq: Two times a day (BID) | ORAL | Status: DC

## 2012-04-21 MED ORDER — FLUCONAZOLE 150 MG PO TABS: 150.0000 mg | ORAL_TABLET | Freq: Once | ORAL | Status: DC

## 2012-04-21 NOTE — Telephone Encounter (Signed)
Patient called to say she saw you this morning and is going to need surgery for endo polyps.  She said she made consult appt with Dr. Velvet Bathe for 05/06/12 but has been thinking about it and if you can do it this coming Friday, Dec 13 she would like to do it then. You can follow your 8:45 case at [facility] if you want to.  Please advise regarding procedure, etc. If you would like me to schedule this Friday. Thanks.

## 2012-04-21 NOTE — Patient Instructions (Signed)
Make appointment to see Dr. Audie Box.

## 2012-04-21 NOTE — Telephone Encounter (Signed)
She would need to check with Dr. Oneida Arenas. I do not believe he would want her to have this Friday and then do her mesh next Wednesday.

## 2012-04-21 NOTE — Telephone Encounter (Signed)
I called patient and told her Dr. Reece Agar would want her to get okay from Dr. Derrell Lolling and Dr. Reece Agar felt that Dr. Derrell Lolling would not want her to have surgery this Friday.  I told her that Dr. Reece Agar said if she got Dr. Jacinto Halim okay he would schedule it to do Friday.  Patient thought about it a minute and decided she wants to just keep her appt with Dr. Velvet Bathe and do this surgery in January.

## 2012-04-21 NOTE — Telephone Encounter (Signed)
Error encounter already opened

## 2012-04-21 NOTE — Progress Notes (Signed)
Patient came back today for ultrasound due to persistent vaginal bleeding with an IUD in place. Please see office notes an earlier this week. Patient's uterus is anteverted and her IUD is seen in the endometrial cavity. Neither PAM falls or myself could identify the right arm of the IUD. The rest of the IUD appeared to be properly placed. Since we do not have an explanation for this unusual bleeding after normal bleeding with the IUD in place we went ahead and did a saline infusion histogram. Multiple small endometrial polyps were seen. They  appeared to be below the IUD in the lower uterine segment.Both her ovaries were normal. Her cul-de-sac was free of fluid.  Because of the invasive procedure with an IUD present we treated her with doxycycline 100 mg twice a day for the next 3 days. She was scheduled for umbilical hernia repair with mesh by Dr. Derrell Lolling next week. I called him all the patient was here and we both agreed that it would be inappropriate to do a vaginal procedure with a risk of infection with IUD present at the time of a mesh procedure. Our plan will be to continue with a Mirena IUD. I told her she could continue to use it for birth control. She will heal from her umbilical herniorrhaphy and then  see Dr. Audie Box to schedule hysteroscopic polypectomy. She knows that there is some risk of dislodging a Mirena IUD during hysteroscopy but he can be replaced.

## 2012-04-21 NOTE — Patient Instructions (Signed)
20 April [name]  04/21/2012   Your procedure is scheduled on: 04/28/12   Report to [facility] at 0715 AM.  Call this number if you have problems the morning of surgery: [phone]   Remember:   Do not eat food:After Midnight.  May have clear liquids:until Midnight .    Take these medicines the morning of surgery with A SIP OF WATER:   Do not wear jewelry, make-up or nail polish.  Do not wear lotions, powders, or perfumes.   Do not shave 48 hours prior to surgery.   Do not bring valuables to the hospital.  Contacts, dentures or bridgework may not be worn into surgery.     Patients discharged the day of surgery will not be allowed to drive home.  Name and phone number of your driver:              SEE CHG INSTRUCTION SHEET    Please read over the following fact sheets that you were given: MRSA Information, coughing and deep breathing exercises, leg exercises               Failure to comply with these instructions may result in cancellation of your surgery.              Patient Signature _______________________________________________________                Nurse Signature _______________________________________________________

## 2012-04-22 ENCOUNTER — Ambulatory Visit (HOSPITAL_COMMUNITY)
Admission: RE | Admit: 2012-04-22 | Discharge: 2012-04-22 | Disposition: A | Discharge status: Home / Self Care | Payer: 59 | Source: Ambulatory Visit | Attending: General Surgery | Admitting: General Surgery

## 2012-04-22 ENCOUNTER — Encounter (HOSPITAL_COMMUNITY): Payer: Self-pay

## 2012-04-22 ENCOUNTER — Encounter (HOSPITAL_COMMUNITY)
Admission: RE | Admit: 2012-04-22 | Discharge: 2012-04-22 | Disposition: A | Discharge status: Home / Self Care | Payer: 59 | Source: Ambulatory Visit | Attending: General Surgery | Admitting: General Surgery

## 2012-04-22 DIAGNOSIS — Z01812 Encounter for preprocedural laboratory examination: Secondary | ICD-10-CM | POA: Insufficient documentation

## 2012-04-22 DIAGNOSIS — I10 Essential (primary) hypertension: Secondary | ICD-10-CM | POA: Insufficient documentation

## 2012-04-22 DIAGNOSIS — K469 Unspecified abdominal hernia without obstruction or gangrene: Secondary | ICD-10-CM | POA: Insufficient documentation

## 2012-04-22 DIAGNOSIS — Z01818 Encounter for other preprocedural examination: Secondary | ICD-10-CM | POA: Insufficient documentation

## 2012-04-22 HISTORY — DX: Cardiac arrhythmia, unspecified: I49.9

## 2012-04-22 HISTORY — DX: Fibromyalgia: M79.7

## 2012-04-22 HISTORY — DX: Anemia, unspecified: D64.9

## 2012-04-22 LAB — SURGICAL PCR SCREEN: MRSA, PCR: NEGATIVE

## 2012-04-22 LAB — CBC
HCT: 38.2 % (ref 36.0–46.0)
MCHC: 33.5 g/dL (ref 30.0–36.0)
Platelets: 403 10*3/uL — ABNORMAL HIGH (ref 150–400)
RDW: 14.6 % (ref 11.5–15.5)
WBC: 13.3 10*3/uL — ABNORMAL HIGH (ref 4.0–10.5)

## 2012-04-22 LAB — BASIC METABOLIC PANEL
BUN: 9 mg/dL (ref 6–23)
GFR calc Af Amer: >90 mL/min (ref >90)
GFR calc non Af Amer: >90 mL/min (ref >90)
Potassium: 3.4 mEq/L — ABNORMAL LOW (ref 3.5–5.1)

## 2012-04-22 LAB — HCG, SERUM, QUALITATIVE: Preg, Serum: NEGATIVE

## 2012-04-22 NOTE — Progress Notes (Signed)
CBC results sent to Inbox of Dr Derrell Lolling.

## 2012-04-26 ENCOUNTER — Ambulatory Visit: Payer: 59 | Admitting: Obstetrics and Gynecology

## 2012-04-26 ENCOUNTER — Other Ambulatory Visit: Payer: 59

## 2012-04-26 NOTE — H&P (Signed)
April [name]     MRN: [id number]   Description: 46 year old female  Provider: Ernestene Mention, MD  Department: [facility]       Diagnoses     Incisional hernia   - Primary    553.21    Acute calculous cholecystitis     574.00       Vitals   BP Pulse Temp Resp Ht Wt    120/80 92 97.1 F (36.2 C) (Temporal) 20 5\' 10"  (1.778 m) 248 lb 6.4 oz (112.674 kg)   BMI - 35.64 kg/m2                 History and Physical     Ernestene Mention, MD   Patient ID: April [name], female   DOB: 1966/05/12, 46 y.o.   MRN: [id number]               HPI April [name] is a 46 y.o. female.  She returns stating that she thinks she was to go ahead and have her hernia repair.   This patient underwent emergency laparoscopic cholecystectomy for acute cholecystitis on 08/12/2011. I saw her in the office on August 20 of this year this time because she was developing a small hernia.   It has gotten a little bit bigger not much more painful but is interested in talking about having this repaired before the end of the year.   Past history reveals hypertension, obesity, otherwise reasonably healthy young woman.   She is scheduled for colonoscopy on November 22 because her father had colon cancer.       Past Medical History   Diagnosis  Date   .  Bite from cat         secondary infection right hand sugery 10/08   .  Hypertension         Past Surgical History   Procedure  Date   .  Tonsillectomy     .  Pilonidal cyst / sinus excision     .  Hand surgery         Cat Bite trauma   .  Mirena         inserted 05-03-09   .  Cholecystectomy  08/12/2011       Procedure: LAPAROSCOPIC CHOLECYSTECTOMY WITH INTRAOPERATIVE CHOLANGIOGRAM;  Surgeon: Ernestene Mention, MD;  Location: [facility];  Service: General;  Laterality: N/A;       Family History   Problem  Relation  Age of Onset   .  Heart disease  Mother     .  Diabetes  Father     .  Colon cancer  Father     .  Breast cancer  Maternal  Grandmother     .  Hypertension  Paternal Grandmother        Social History History   Substance Use Topics   .  Smoking status:  Former Games developer   .  Smokeless tobacco:  Not on file   .  Alcohol Use:  Yes         rare       Allergies   Allergen  Reactions   .  Amoxicillin         REACTION: hives   .  Penicillins         REACTION: hives       Current Outpatient Prescriptions   Medication  Sig  Dispense  Refill   .  acetaminophen (TYLENOL) 325 MG tablet  Take 2 tablets (650 mg total) by mouth every 4 (four) hours as needed.         Marland Kitchen  atenolol-chlorthalidone (TENORETIC) 50-25 MG per tablet  TAKE 1 TABLET DAILY   90 tablet   0   .  Calcium Carbonate-Vit D-Min (CALTRATE PLUS PO)  Take by mouth.           .  loratadine (CLARITIN) 10 MG tablet  Take 10 mg by mouth daily.           .  Multiple Vitamin (MULTIVITAMIN) capsule  Take 1 capsule by mouth daily.              Review of Systems   Constitutional: Negative for fever, chills and unexpected weight change.  HENT: Negative for hearing loss, congestion, sore throat, trouble swallowing and voice change.   Eyes: Negative for visual disturbance.  Respiratory: Negative for cough and wheezing.   Cardiovascular: Negative for chest pain, palpitations and leg swelling.  Gastrointestinal: Positive for abdominal pain. Negative for nausea, vomiting, diarrhea, constipation, blood in stool, abdominal distention and anal bleeding.  Genitourinary: Negative for hematuria, vaginal bleeding and difficulty urinating.  Musculoskeletal: Negative for arthralgias.  Skin: Negative for rash and wound.  Neurological: Negative for seizures, syncope and headaches.  Hematological: Negative for adenopathy. Does not bruise/bleed easily.  Psychiatric/Behavioral: Negative for confusion.    Blood pressure 120/80, pulse 92, temperature 97.1 F (36.2 C), temperature source Temporal, resp. rate 20, height 5\' 10"  (1.778 m), weight 248 lb 6.4 oz (112.674  kg).   Physical Exam   Constitutional: She is oriented to person, place, and time. She appears well-developed and well-nourished. No distress.  HENT:   Mouth/Throat: No oropharyngeal exudate.  Neck: Neck supple. No JVD present. No tracheal deviation present. No thyromegaly present.  Cardiovascular: Normal rate, regular rhythm, normal heart sounds and intact distal pulses.    No murmur heard. Pulmonary/Chest: Effort normal and breath sounds normal. No respiratory distress. She has no wheezes. She has no rales. She exhibits no tenderness.  Abdominal: Soft. Bowel sounds are normal. She exhibits no distension and no mass. There is no tenderness. There is no rebound and no guarding.       Small incarcerated hernia in the umbilical trocar site. Could not completely reduced. Skin otherwise healthy. Obese but soft and nontender otherwise.  Lymphadenopathy:    She has no cervical adenopathy.  Neurological: She is alert and oriented to person, place, and time. She exhibits normal muscle tone. Coordination normal.  Skin: Skin is warm. No rash noted. She is not diaphoretic. No erythema. No pallor.  Psychiatric: She has a normal mood and affect. Her behavior is normal. Judgment and thought content normal.    Data Reviewed Old chart   Assessment Incarcerated umbilical incisional hernia   History of emergency laparoscopic cholecystectomy   Hypertension   Mild obesity   Plan Scheduled for elective repair of incisional hernia with mesh. Because this is relatively small I think we can do this with a transverse infraumbilical incision and inlay disc mesh.   I discussed the indications, and details, techniques, and numerous risks of the surgery with the patient. She's been given written information and we have discussed diagrams about the technique.  We talked about temporary disability issues. She understands these issues. Her questions were answered. She agrees with this  plan.     Angelia Mould. Derrell Lolling, M.D., [facility] General and Minimally invasive Surgery Breast and Colorectal Surgery Office:  [phone] Pager:   [phone]

## 2012-04-28 ENCOUNTER — Ambulatory Visit (HOSPITAL_COMMUNITY): Payer: 59 | Admitting: Anesthesiology

## 2012-04-28 ENCOUNTER — Encounter (HOSPITAL_COMMUNITY): Payer: Self-pay | Admitting: Anesthesiology

## 2012-04-28 ENCOUNTER — Encounter (HOSPITAL_COMMUNITY): Payer: Self-pay | Admitting: *Deleted

## 2012-04-28 ENCOUNTER — Encounter (HOSPITAL_COMMUNITY)
Admission: RE | Disposition: A | Discharge status: Home / Self Care | Payer: Self-pay | Source: Ambulatory Visit | Attending: General Surgery

## 2012-04-28 ENCOUNTER — Ambulatory Visit (HOSPITAL_COMMUNITY)
Admission: RE | Admit: 2012-04-28 | Discharge: 2012-04-28 | Disposition: A | Discharge status: Home / Self Care | Payer: 59 | Source: Ambulatory Visit | Attending: General Surgery | Admitting: General Surgery

## 2012-04-28 DIAGNOSIS — I10 Essential (primary) hypertension: Secondary | ICD-10-CM | POA: Insufficient documentation

## 2012-04-28 DIAGNOSIS — K430 Incisional hernia with obstruction, without gangrene: Secondary | ICD-10-CM | POA: Insufficient documentation

## 2012-04-28 DIAGNOSIS — Z9089 Acquired absence of other organs: Secondary | ICD-10-CM | POA: Insufficient documentation

## 2012-04-28 DIAGNOSIS — E669 Obesity, unspecified: Secondary | ICD-10-CM | POA: Insufficient documentation

## 2012-04-28 DIAGNOSIS — K432 Incisional hernia without obstruction or gangrene: Secondary | ICD-10-CM

## 2012-04-28 DIAGNOSIS — K469 Unspecified abdominal hernia without obstruction or gangrene: Secondary | ICD-10-CM | POA: Diagnosis present

## 2012-04-28 DIAGNOSIS — Z79899 Other long term (current) drug therapy: Secondary | ICD-10-CM | POA: Insufficient documentation

## 2012-04-28 HISTORY — PX: INCISIONAL HERNIA REPAIR: SHX193

## 2012-04-28 HISTORY — PX: INSERTION OF MESH: SHX5868

## 2012-04-28 SURGERY — REPAIR, HERNIA, INCISIONAL
Anesthesia: General | Site: Abdomen | Wound class: Clean

## 2012-04-28 MED ORDER — ACETAMINOPHEN 325 MG PO TABS: 650.0000 mg | ORAL_TABLET | ORAL | Status: DC | PRN

## 2012-04-28 MED ORDER — ATENOLOL 50 MG PO TABS
50.0000 mg | ORAL_TABLET | Freq: Once | ORAL | Status: AC
  Administered 2012-04-28: 50 mg via ORAL
  Filled 2012-04-28: qty 1

## 2012-04-28 MED ORDER — BUPIVACAINE-EPINEPHRINE PF 0.5-1:200000 % IJ SOLN
INTRAMUSCULAR | Status: DC | PRN
  Administered 2012-04-28: 10 mL

## 2012-04-28 MED ORDER — MORPHINE SULFATE 10 MG/ML IJ SOLN: 2.0000 mg | INTRAMUSCULAR | Status: DC | PRN

## 2012-04-28 MED ORDER — OXYCODONE-ACETAMINOPHEN 7.5-325 MG PO TABS: 1.0000 | ORAL_TABLET | ORAL | Status: DC | PRN

## 2012-04-28 MED ORDER — MIDAZOLAM HCL 5 MG/5ML IJ SOLN
INTRAMUSCULAR | Status: DC | PRN
  Administered 2012-04-28: 2 mg via INTRAVENOUS

## 2012-04-28 MED ORDER — DEXAMETHASONE SODIUM PHOSPHATE 4 MG/ML IJ SOLN
INTRAMUSCULAR | Status: DC | PRN
  Administered 2012-04-28: 10 mg via INTRAVENOUS

## 2012-04-28 MED ORDER — ACETAMINOPHEN 10 MG/ML IV SOLN
INTRAVENOUS | Status: DC | PRN
  Administered 2012-04-28: 1000 mg via INTRAVENOUS

## 2012-04-28 MED ORDER — SODIUM CHLORIDE 0.9 % IJ SOLN: 3.0000 mL | INTRAMUSCULAR | Status: DC | PRN

## 2012-04-28 MED ORDER — BUPIVACAINE-EPINEPHRINE 0.5% -1:200000 IJ SOLN
INTRAMUSCULAR | Status: AC
  Filled 2012-04-28: qty 1

## 2012-04-28 MED ORDER — FENTANYL CITRATE 0.05 MG/ML IJ SOLN: 25.0000 ug | INTRAMUSCULAR | Status: DC | PRN

## 2012-04-28 MED ORDER — FENTANYL CITRATE 0.05 MG/ML IJ SOLN
INTRAMUSCULAR | Status: DC | PRN
  Administered 2012-04-28: 50 ug via INTRAVENOUS
  Administered 2012-04-28: 25 ug via INTRAVENOUS
  Administered 2012-04-28: 50 ug via INTRAVENOUS
  Administered 2012-04-28 (×2): 25 ug via INTRAVENOUS
  Administered 2012-04-28: 50 ug via INTRAVENOUS
  Administered 2012-04-28: 25 ug via INTRAVENOUS

## 2012-04-28 MED ORDER — OXYCODONE HCL 5 MG PO TABS
5.0000 mg | ORAL_TABLET | ORAL | Status: DC | PRN
  Administered 2012-04-28 (×2): 5 mg via ORAL
  Filled 2012-04-28 (×2): qty 1

## 2012-04-28 MED ORDER — VANCOMYCIN HCL 10 G IV SOLR
1500.0000 mg | INTRAVENOUS | Status: AC
  Administered 2012-04-28: 1500 mg via INTRAVENOUS
  Filled 2012-04-28: qty 1500

## 2012-04-28 MED ORDER — ONDANSETRON HCL 4 MG/2ML IJ SOLN: 4.0000 mg | Freq: Four times a day (QID) | INTRAMUSCULAR | Status: DC | PRN

## 2012-04-28 MED ORDER — CHLORHEXIDINE GLUCONATE 4 % EX LIQD
1.0000 "application " | Freq: Once | CUTANEOUS | Status: DC
  Filled 2012-04-28: qty 15

## 2012-04-28 MED ORDER — MEPERIDINE HCL 50 MG/ML IJ SOLN: 6.2500 mg | INTRAMUSCULAR | Status: DC | PRN

## 2012-04-28 MED ORDER — LACTATED RINGERS IV SOLN: INTRAVENOUS | Status: DC

## 2012-04-28 MED ORDER — GLYCOPYRROLATE 0.2 MG/ML IJ SOLN
INTRAMUSCULAR | Status: DC | PRN
  Administered 2012-04-28: 0.6 mg via INTRAVENOUS

## 2012-04-28 MED ORDER — NEOSTIGMINE METHYLSULFATE 1 MG/ML IJ SOLN
INTRAMUSCULAR | Status: DC | PRN
  Administered 2012-04-28: 4 mg via INTRAVENOUS

## 2012-04-28 MED ORDER — PROMETHAZINE HCL 25 MG/ML IJ SOLN: 6.2500 mg | INTRAMUSCULAR | Status: DC | PRN

## 2012-04-28 MED ORDER — LACTATED RINGERS IV SOLN
INTRAVENOUS | Status: DC
  Administered 2012-04-28: 1000 mL via INTRAVENOUS
  Administered 2012-04-28: 10:00:00 via INTRAVENOUS

## 2012-04-28 MED ORDER — KETAMINE HCL 10 MG/ML IJ SOLN
INTRAMUSCULAR | Status: DC | PRN
  Administered 2012-04-28: 25 mg via INTRAVENOUS
  Administered 2012-04-28 (×2): 2 mg via INTRAVENOUS
  Administered 2012-04-28: 25 mg via INTRAVENOUS
  Administered 2012-04-28: 8 mg via INTRAVENOUS

## 2012-04-28 MED ORDER — ACETAMINOPHEN 10 MG/ML IV SOLN
INTRAVENOUS | Status: AC
  Filled 2012-04-28: qty 100

## 2012-04-28 MED ORDER — METOCLOPRAMIDE HCL 5 MG/ML IJ SOLN
INTRAMUSCULAR | Status: DC | PRN
  Administered 2012-04-28: 10 mg via INTRAVENOUS

## 2012-04-28 MED ORDER — SODIUM CHLORIDE 0.9 % IJ SOLN: 3.0000 mL | Freq: Two times a day (BID) | INTRAMUSCULAR | Status: DC

## 2012-04-28 MED ORDER — SODIUM CHLORIDE 0.9 % IV SOLN: 250.0000 mL | INTRAVENOUS | Status: DC | PRN

## 2012-04-28 MED ORDER — ACETAMINOPHEN 650 MG RE SUPP
650.0000 mg | RECTAL | Status: DC | PRN
  Filled 2012-04-28: qty 1

## 2012-04-28 MED ORDER — ONDANSETRON HCL 4 MG/2ML IJ SOLN
INTRAMUSCULAR | Status: DC | PRN
  Administered 2012-04-28: 4 mg via INTRAVENOUS

## 2012-04-28 MED ORDER — PROPOFOL 10 MG/ML IV BOLUS
INTRAVENOUS | Status: DC | PRN
  Administered 2012-04-28: 150 mg via INTRAVENOUS

## 2012-04-28 MED ORDER — HEPARIN SODIUM (PORCINE) 5000 UNIT/ML IJ SOLN
5000.0000 [IU] | Freq: Once | INTRAMUSCULAR | Status: AC
  Administered 2012-04-28: 5000 [IU] via SUBCUTANEOUS
  Filled 2012-04-28: qty 1

## 2012-04-28 MED ORDER — ROCURONIUM BROMIDE 100 MG/10ML IV SOLN
INTRAVENOUS | Status: DC | PRN
  Administered 2012-04-28: 30 mg via INTRAVENOUS

## 2012-04-28 MED ORDER — SODIUM CHLORIDE 0.9 % IV SOLN: INTRAVENOUS | Status: DC

## 2012-04-28 SURGICAL SUPPLY — 39 items
ADH SKN CLS APL DERMABOND .7 (GAUZE/BANDAGES/DRESSINGS) ×2
APL SKNCLS STERI-STRIP NONHPOA (GAUZE/BANDAGES/DRESSINGS)
BENZOIN TINCTURE PRP APPL 2/3 (GAUZE/BANDAGES/DRESSINGS) IMPLANT
BINDER ABD UNIV 12 45-62 (WOUND CARE) IMPLANT
BINDER ABDOMINAL 46IN 62IN (WOUND CARE)
CANISTER SUCTION 2500CC (MISCELLANEOUS) ×2 IMPLANT
CLOTH BEACON ORANGE TIMEOUT ST (SAFETY) ×2 IMPLANT
DECANTER SPIKE VIAL GLASS SM (MISCELLANEOUS) ×2 IMPLANT
DERMABOND ADVANCED (GAUZE/BANDAGES/DRESSINGS) ×2
DERMABOND ADVANCED .7 DNX12 (GAUZE/BANDAGES/DRESSINGS) IMPLANT
DEVICE SECURE STRAP 25 ABSORB (INSTRUMENTS) IMPLANT
DRAIN CHANNEL RND F F (WOUND CARE) ×1 IMPLANT
DRAPE LAPAROSCOPIC ABDOMINAL (DRAPES) ×2 IMPLANT
ELECT REM PT RETURN 9FT ADLT (ELECTROSURGICAL) ×2
ELECTRODE REM PT RTRN 9FT ADLT (ELECTROSURGICAL) ×1 IMPLANT
GLOVE BIOGEL PI IND STRL 7.0 (GLOVE) ×1 IMPLANT
GLOVE BIOGEL PI INDICATOR 7.0 (GLOVE)
GLOVE EUDERMIC 7 POWDERFREE (GLOVE) ×2 IMPLANT
GOWN STRL NON-REIN LRG LVL3 (GOWN DISPOSABLE) ×1 IMPLANT
GOWN STRL REIN XL XLG (GOWN DISPOSABLE) ×5 IMPLANT
KIT BASIN OR (CUSTOM PROCEDURE TRAY) ×2 IMPLANT
MARKER SKIN DUAL TIP RULER LAB (MISCELLANEOUS) ×1 IMPLANT
MESH VENTRALEX ST 8CM LRG (Mesh General) ×1 IMPLANT
NDL SPNL 22GX3.5 QUINCKE BK (NEEDLE) ×1 IMPLANT
NEEDLE SPNL 22GX3.5 QUINCKE BK (NEEDLE) IMPLANT
NS IRRIG 1000ML POUR BTL (IV SOLUTION) ×2 IMPLANT
PACK GENERAL/GYN (CUSTOM PROCEDURE TRAY) ×2 IMPLANT
SOLUTION ANTI FOG 6CC (MISCELLANEOUS) ×1 IMPLANT
STAPLER VISISTAT 35W (STAPLE) ×1 IMPLANT
STRIP CLOSURE SKIN 1/2X4 (GAUZE/BANDAGES/DRESSINGS) IMPLANT
SUT MNCRL AB 4-0 PS2 18 (SUTURE) ×2 IMPLANT
SUT NOVA 0 T19/GS 22DT (SUTURE) ×2 IMPLANT
SUT NOVA 1 T20/GS 25DT (SUTURE) ×2 IMPLANT
SUT VIC AB 3-0 SH 18 (SUTURE) ×1 IMPLANT
TACKER 5MM HERNIA 3.5CML NAB (ENDOMECHANICALS) ×1 IMPLANT
TOWEL OR 17X26 10 PK STRL BLUE (TOWEL DISPOSABLE) ×2 IMPLANT
TOWEL OR NON WOVEN STRL DISP B (DISPOSABLE) ×1 IMPLANT
TRAY FOLEY CATH 14FRSI W/METER (CATHETERS) ×1 IMPLANT
TUBING INSUFFLATION 10FT LAP (TUBING) ×1 IMPLANT

## 2012-04-28 NOTE — Preoperative (Signed)
Beta Blockers   Reason not to administer Beta Blockers:Not Applicable, took BB this am 

## 2012-04-28 NOTE — Anesthesia Postprocedure Evaluation (Signed)
  Anesthesia Post-op Note  Patient: April [name]  Procedure(s) Performed: Procedure(s) (LRB): HERNIA REPAIR INCISIONAL (N/A) INSERTION OF MESH (N/A)  Patient Location: PACU  Anesthesia Type: General  Level of Consciousness: awake and alert   Airway and Oxygen Therapy: Patient Spontanous Breathing  Post-op Pain: mild  Post-op Assessment: Post-op Vital signs reviewed, Patient's Cardiovascular Status Stable, Respiratory Function Stable, Patent Airway and No signs of Nausea or vomiting  Last Vitals:  Filed Vitals:   04/28/12 1411  BP: 127/83  Pulse: 85  Temp: 37 C  Resp: 16    Post-op Vital Signs: stable   Complications: No apparent anesthesia complications

## 2012-04-28 NOTE — Transfer of Care (Signed)
Immediate Anesthesia Transfer of Care Note  Patient: April [name]  Procedure(s) Performed: Procedure(s) (LRB) with comments: HERNIA REPAIR INCISIONAL (N/A) INSERTION OF MESH (N/A)  Patient Location: PACU  Anesthesia Type:General  Level of Consciousness: awake, patient cooperative and responds to stimulation  Airway & Oxygen Therapy: Patient Spontanous Breathing and Patient connected to face mask  Post-op Assessment: Report given to PACU RN, Post -op Vital signs reviewed and stable and Patient moving all extremities  Post vital signs: Reviewed and stable  Complications: No apparent anesthesia complications

## 2012-04-28 NOTE — Interval H&P Note (Signed)
History and Physical Interval Note:  04/28/2012 9:40 AM  April [name]  has presented today for surgery, with the diagnosis of incisional hernia  The goals and the various methods of treatment have been discussed with the patient and family. After consideration of risks, benefits and other options for treatment, the patient has consented to  Procedure(s) (LRB) with comments: HERNIA REPAIR INCISIONAL (N/A) INSERTION OF MESH (N/A) as a surgical intervention .  The patient's history has been reviewed, patient examined, no change in status, stable for surgery.  I have reviewed the patient's chart and labs.  Questions were answered to the patient's satisfaction.     Ernestene Mention

## 2012-04-28 NOTE — Op Note (Signed)
Patient Name:           April [name]   Date of Surgery:        04/28/2012  Pre op Diagnosis:      Ventral incisional hernia  Post op Diagnosis:    Ventral incisional hernia  Procedure:                 Open repair of ventral incisional hernia, implantation of 8 cm diameter ventralex mesh  Surgeon:                     Angelia Mould. Derrell Lolling, M.D., FACS  Assistant:                      None  Operative Indications:   This is a 46 year old female who underwent emergency laparoscopic cholecystectomy for acute cholecystitis on 08/12/2011. She recovered uneventfully. She went back to the gym to  workout. In August she was evaluated by me because of a small hernia at the umbilical incision. This has gotten bigger and but with some pain although not severe and she wants to have this repaired. She has healed incisions from her gallbladder surgery and a hernia defect of the umbilicus probably 2 cm in size. Comorbidities include obesity and hypertension.  Operative Findings:       The defect in the umbilical incision was about 3 cm in size. It was not incarcerated. I chose to use an 8 cm diameter Bard Ventralex mesh.  Procedure in Detail:          Following the induction of general endotracheal anesthesia the patient's abdomen was prepped and draped in a sterile fashion. Intravenous antibiotics were given. Surgical time out was performed. 0.5% Marcaine with epinephrine was used as local infiltration anesthetic. A curvilinear transverse incision was made at the lower rim of the umbilicus. This incision was probably 5-6 cm in length. Dissection was carried down into the subcutaneous tissue. I encountered the hernia sac and then slowly opened this and debrided this and then could explore the abdominal cavity with my finger. There were no adhesions around the hernia. I further exposed the hernia defect and then undermined and dissected the subcutaneous tissue away from this circumferentially. Since the defect was almost 3  cm in size I chose to use an 8 cm diameter piece of mesh. The mesh was brought the operative field and the tails were cut off. I sutured the mesh as an inlay with 6 interrupted mattress sutures of #1 Novofil. These were placed at equidistant points around the circumference of the mesh. After all 6 Novafil sutures were placed the mesh was folded and inserted into the abdominal cavity. The sutures were elevated to the mesh deployed nicely and without any redundancy. There did not seem to be any gaps. I tied the sutures down and again inspected the repair and it appeared tight and secure. There was no entrapped bowel. The wound was irrigated. The fascia was closed transversely with interrupted sutures of #1 Novofil. Subcutaneous tissue was closed with 3-0 Vicryl sutures and the skin closed with a running subcuticular suture of 4-0 Monocryl and Dermabond. The patient tolerated the procedure well was taken to recovery in stable condition. EBL 15 cc or less. Complications none. Counts correct.    Angelia Mould. Derrell Lolling, M.D., FACS General and Minimally Invasive Surgery Breast and Colorectal Surgery  04/28/2012 11:03 AM

## 2012-04-28 NOTE — Anesthesia Preprocedure Evaluation (Signed)
Anesthesia Evaluation  Patient identified by MRN, date of birth, ID band Patient awake    Reviewed: Allergy & Precautions, H&P , NPO status , Patient's Chart, lab work & pertinent test results  Airway Mallampati: II TM Distance: <3 FB Neck ROM: Full    Dental No notable dental hx.    Pulmonary neg pulmonary ROS,  breath sounds clear to auscultation  Pulmonary exam normal       Cardiovascular hypertension, Pt. on medications Rhythm:Regular Rate:Normal     Neuro/Psych negative neurological ROS  negative psych ROS   GI/Hepatic negative GI ROS, Neg liver ROS,   Endo/Other  Morbid obesity  Renal/GU negative Renal ROS  negative genitourinary   Musculoskeletal negative musculoskeletal ROS (+) Fibromyalgia -  Abdominal   Peds negative pediatric ROS (+)  Hematology negative hematology ROS (+)   Anesthesia Other Findings   Reproductive/Obstetrics negative OB ROS                           Anesthesia Physical  Anesthesia Plan  ASA: II  Anesthesia Plan: General   Post-op Pain Management:    Induction: Intravenous  Airway Management Planned: Oral ETT  Additional Equipment:   Intra-op Plan:   Post-operative Plan: Extubation in OR  Informed Consent: I have reviewed the patients History and Physical, chart, labs and discussed the procedure including the risks, benefits and alternatives for the proposed anesthesia with the patient or authorized representative who has indicated his/her understanding and acceptance.   Dental advisory given  Plan Discussed with: CRNA  Anesthesia Plan Comments:         Anesthesia Quick Evaluation

## 2012-04-29 ENCOUNTER — Telehealth (INDEPENDENT_AMBULATORY_CARE_PROVIDER_SITE_OTHER): Payer: Self-pay | Admitting: General Surgery

## 2012-04-29 ENCOUNTER — Encounter (HOSPITAL_COMMUNITY): Payer: Self-pay | Admitting: General Surgery

## 2012-04-29 NOTE — Telephone Encounter (Signed)
Called and left message for patient to advise of post op appointment that has been set to Dr. Derrell Lolling on 05/21/11 at 8:15. Requested patient call back to confirm and/or if that day and time does not work for her.

## 2012-04-29 NOTE — Telephone Encounter (Signed)
Pt called to report the pain medicine is not working very well.  She is taking Percocet 1 Q4H.  Reassured pt and scheduled her pain medicine for:  2 Percocet Q4H x3 and add Aleve 2 Q12H.  She understands to drop back to 1 Percocet after 3 doses.  Will call back if pain is not improved.

## 2012-05-06 ENCOUNTER — Encounter: Payer: Self-pay | Admitting: Gynecology

## 2012-05-06 ENCOUNTER — Ambulatory Visit (INDEPENDENT_AMBULATORY_CARE_PROVIDER_SITE_OTHER): Payer: 59 | Admitting: Gynecology

## 2012-05-06 DIAGNOSIS — N949 Unspecified condition associated with female genital organs and menstrual cycle: Secondary | ICD-10-CM

## 2012-05-06 DIAGNOSIS — N898 Other specified noninflammatory disorders of vagina: Secondary | ICD-10-CM

## 2012-05-06 DIAGNOSIS — T839XXA Unspecified complication of genitourinary prosthetic device, implant and graft, initial encounter: Secondary | ICD-10-CM

## 2012-05-06 DIAGNOSIS — N938 Other specified abnormal uterine and vaginal bleeding: Secondary | ICD-10-CM

## 2012-05-06 DIAGNOSIS — N925 Other specified irregular menstruation: Secondary | ICD-10-CM

## 2012-05-06 DIAGNOSIS — E876 Hypokalemia: Secondary | ICD-10-CM

## 2012-05-06 DIAGNOSIS — N840 Polyp of corpus uteri: Secondary | ICD-10-CM

## 2012-05-06 LAB — FOLLICLE STIMULATING HORMONE: FSH: 7.6 m[IU]/mL

## 2012-05-06 LAB — TSH: TSH: 1.415 u[IU]/mL (ref 0.350–4.500)

## 2012-05-06 LAB — WET PREP FOR TRICH, YEAST, CLUE

## 2012-05-06 MED ORDER — METRONIDAZOLE 500 MG PO TABS: 500.0000 mg | ORAL_TABLET | Freq: Two times a day (BID) | ORAL | Status: DC

## 2012-05-06 NOTE — Patient Instructions (Addendum)
Take Flagyl antibiotic twice daily for 7 days, avoid alcohol while taking. Office will contact you to arrange surgery.

## 2012-05-06 NOTE — Progress Notes (Signed)
Patient presents with history of 4-5 months of spotting on and off almost on a daily basis.  Had sonohysterogram by Dr. Eda Paschal 04/21/2012 which showed endometrial polyps low in the cavity and her IUD in the cavity although the right arm was not visualized.  Patient presents to discuss where to go from here as Dr. Eda Paschal is retiring. Also notes a stringy mucousy discharge on and off throughout the month.  Exam with [facility] assistant Abdomen with freshly healing transverse infraumbilical incision site with Dermabond Pelvic external BUS vagina with whitish discharge. Cervix normal with IUD string visualized. Uterus grossly normal in size midline mobile nontender. Adnexa without masses or tenderness.  Assessment and plan: 1. Breakthrough bleeding 4-5 months with Mirena IUD placed 04/2009. Sonohysterogram suggestive of endometrial polyps. Check baseline TSH FSH. Discussed options. Feel the most appropriate at this point would be to remove her IUD proceed with hysteroscopy D&C and removal of any intracavitary abnormalities. Birth control is an issue and alternatives for birth control were reviewed with her to include hormonal/replacement of her IUD/Nexplanon/sterilization to include tubal/essure/vasectomy.  Risks benefits/pros and cons of each choice reviewed. She is being followed for hypertension at age 69 and I do not think that estrogen-containing hormonal would be the best choice. Not married and not ready to make a sterilization decision. I think given the options replacement of her Mirena IUD would be the most appropriate choice. Would recommend waiting and doing at the postop appointment to decrease the expulsion rate of placed immediately intraoperative.  Which involved with hysteroscopy D&C reviewed to include the intraoperative postoperative courses and instrumentation including use of the resectoscope and D&C portion of the procedure. The risks of infection prolonged antibiotics hemorrhage  necessitating transfusion and the risks of transfusion including transfusion reaction hepatitis HIV mad cow disease and other unknown entities. The risk of uterine perforation, damage to internal organs including bowel bladder ureters vessels and nerves either immediately recognized or delay recognized necessitating major exploratory reparative surgeries and future reparative surgeries including ostomy formation bowel resection bladder repair ureteral damage repair was reviewed with her. Distended media absorption leading to metabolic complications such as coma seizures also discussed. She did ask me if we did nothing in observed and left the IUD. The issues of continued spotting as well as risks of atypia although low in the polyps given she does have a Mirena IUD was discussed. The patient wants to proceed with hysteroscopy D&C with removal of her IUD and will schedule this at her convenience. She is immediately postop from her infraumbilical hernia repair with mesh and will plan a month or so out from this to allow healing. Patient will present for short preop consult before him. 2. Questionable malposition of the IUD. Unable to visualize the right arm of the IUD. I reviewed with her that more than likely it will remain efficacious as far as birth control but cannot guarantee this. Otherwise appears to be within the appropriate area of the uterine cavity. Regardless we'll remove when we do the hysteroscopy D&C. 3. Vaginal discharge. KOH wet prep suggestive of bacterial vaginosis. We'll treat with Flagyl 500 mg twice daily for 7 days, alcohol points reviewed. 4. Hypokalemia. Recent blood work did show a potassium of 3.4 and she is on a diuretic. Will repeat electrolytes now. The need to supplement potassium was discussed with her.

## 2012-05-14 ENCOUNTER — Other Ambulatory Visit: Payer: Self-pay | Admitting: Gynecology

## 2012-05-14 MED ORDER — MISOPROSTOL 200 MCG PO TABS: ORAL_TABLET | ORAL | Status: DC

## 2012-05-20 ENCOUNTER — Encounter (INDEPENDENT_AMBULATORY_CARE_PROVIDER_SITE_OTHER): Payer: Self-pay | Admitting: General Surgery

## 2012-05-20 ENCOUNTER — Ambulatory Visit (INDEPENDENT_AMBULATORY_CARE_PROVIDER_SITE_OTHER): Payer: 59 | Admitting: General Surgery

## 2012-05-20 VITALS — BP 134/86 | HR 74 | Temp 97.8°F | Resp 18 | Ht 68.5 in | Wt 241.2 lb

## 2012-05-20 DIAGNOSIS — K469 Unspecified abdominal hernia without obstruction or gangrene: Secondary | ICD-10-CM

## 2012-05-20 NOTE — Patient Instructions (Signed)
The incision on your abdomen from your hernia repair is healing without any sign of complication.  The piece of mesh was 8 cm in diameter.  You may resume all normal activities without restriction after January 18.  Return to see Dr. Derrell Lolling if further problems arise.

## 2012-05-20 NOTE — Progress Notes (Signed)
Patient ID: April [name], female   DOB: 11-13-1965, 47 y.o.   MRN: [id number] History: This patient underwent open repair of ventral incisional hernia with mesh on 04/28/2012. 8 cm piece of mesh was used. She feels well and  much better than preop.   no complaints. No wound problems  Exam: Patient looks well. No distress Abdomen soft. Transverse incision below umbilicus healing without any sign of infection or hematoma. Hernia repair intact. No fluid collections  Assessment: Ventral incisional hernia, recovering uneventfully following open repair with mesh  Plan: Resume normal activities after January 18 Return to see me prn.    Angelia Mould. Derrell Lolling, M.D., [facility] General and Minimally invasive Surgery Breast and Colorectal Surgery Office:   [phone] Pager:   [phone]

## 2012-05-28 ENCOUNTER — Telehealth: Payer: Self-pay

## 2012-05-28 ENCOUNTER — Other Ambulatory Visit: Payer: Self-pay | Admitting: Family Medicine

## 2012-05-28 NOTE — Telephone Encounter (Signed)
That is always such a difficult question to answer for a patient. I cannot guarantee her that nothing bad such as cancer is going on but it would be highly unlikely. My level of concern is low but never 0. She'll have to make that decision which best fits and I will accommodate her.  I do not think it would be crazy to wait if it would make such a big financial difference as I think the procedures is more due to her irregular bleeding than my level of concern for cancer but again I did this with the above disclaimer.

## 2012-05-28 NOTE — Telephone Encounter (Signed)
I read patient what Dr. Velvet Bathe had written below (disclaimer) regarding postponing surgery.  Patient does want to cancel her surgery and said she will reschedule for April or May.  We agreed that I will call her when I get the April schedule and she will consider if she is ready at that point.  Surgery cancelled.

## 2012-05-28 NOTE — Telephone Encounter (Signed)
Patient saw you in Dec and was scheduled for [facility], Hysteroscopy.  Patient received financial letter and cannot afford surgery at this time.  She asked how long she could acceptably postpone?  She asked about April or May if that would be too long to wait.

## 2012-06-08 ENCOUNTER — Institutional Professional Consult (permissible substitution): Payer: 59 | Admitting: Gynecology

## 2012-06-11 ENCOUNTER — Ambulatory Visit: Admit: 2012-06-11 | Payer: 59 | Admitting: Gynecology

## 2012-06-11 SURGERY — DILATATION & CURETTAGE/HYSTEROSCOPY WITH TRUCLEAR
Anesthesia: General

## 2012-06-22 ENCOUNTER — Telehealth: Payer: Self-pay

## 2012-06-22 NOTE — Telephone Encounter (Signed)
Surgery scheduled for April 4th 7:30am at [facility]  Date was requested by patient.  Preop consult scheduled March 26 4:00pm.  Patient informed.

## 2012-06-22 NOTE — Telephone Encounter (Signed)
Patient called to reschedule surgery.

## 2012-06-26 ENCOUNTER — Other Ambulatory Visit: Payer: Self-pay

## 2012-07-06 ENCOUNTER — Ambulatory Visit (INDEPENDENT_AMBULATORY_CARE_PROVIDER_SITE_OTHER): Payer: 59 | Admitting: Family Medicine

## 2012-07-06 ENCOUNTER — Encounter: Payer: Self-pay | Admitting: Family Medicine

## 2012-07-06 VITALS — BP 130/90 | Temp 99.6°F | Wt 246.0 lb

## 2012-07-06 DIAGNOSIS — E669 Obesity, unspecified: Secondary | ICD-10-CM

## 2012-07-06 DIAGNOSIS — I10 Essential (primary) hypertension: Secondary | ICD-10-CM

## 2012-07-06 MED ORDER — ATENOLOL-CHLORTHALIDONE 50-25 MG PO TABS: 1.0000 | ORAL_TABLET | Freq: Every day | ORAL | Status: DC

## 2012-07-06 NOTE — Progress Notes (Signed)
  Subjective:    Patient ID: April [name], female    DOB: [birth year], 47 y.o.   MRN: [id number]  HPI April [name] is a 47 year old female who comes in for yearly evaluation of hypertension  She takes Tenoretic for 2 days 25 daily BP at home 117/72 BP today 130/90  She sees her GYN on a regular basis for pelvics and Paps. She's up on her health maintenance activities.  Recently had an umbilical hernia repair with mesh   Review of Systems    review of systems weight still problematic at 246 Objective:   Physical Exam  Well-developed well-nourished female no acute distress cardiopulmonary exam normal      Assessment & Plan:  Hypertension actually BP too low cut her medication in half monitor BP followup 1 year sooner if any problems

## 2012-07-06 NOTE — Patient Instructions (Addendum)
Decrease the Tenoretic take one half tablet daily  Monitor your blood pressure daily for 3 weeks  BP: 35/85 or less  Return in one year sooner if any problems

## 2012-08-04 ENCOUNTER — Encounter: Payer: Self-pay | Admitting: Gynecology

## 2012-08-04 ENCOUNTER — Ambulatory Visit (INDEPENDENT_AMBULATORY_CARE_PROVIDER_SITE_OTHER): Payer: 59 | Admitting: Gynecology

## 2012-08-04 DIAGNOSIS — T8389XD Other specified complication of genitourinary prosthetic devices, implants and grafts, subsequent encounter: Secondary | ICD-10-CM

## 2012-08-04 DIAGNOSIS — N926 Irregular menstruation, unspecified: Secondary | ICD-10-CM

## 2012-08-04 DIAGNOSIS — Z5189 Encounter for other specified aftercare: Secondary | ICD-10-CM

## 2012-08-04 NOTE — Patient Instructions (Signed)
Followup for surgery as scheduled. 

## 2012-08-04 NOTE — H&P (Signed)
April [name] 01-04-1966 [id number]   History and Physical  Chief complaint:  Persistent irregular vaginal bleeding  History of present illness: 47 y.o. G0P0 with history of 7-8 months of spotting on and off almost on a daily basis. Had sonohysterogram 04/21/2012 which showed endometrial polyps low in the cavity and her IUD in the cavity although the right arm was not visualized. Hormone studies to include FSH TSH were normal. Options for management were reviewed and the patient elects for removal of her IUD hysteroscopy D&C and removal of endometrial polyps.     Past medical history,surgical history, medications, allergies, family history and social history were all reviewed and documented in the EPIC chart. ROS:  Was performed and pertinent positives and negatives are included in the history of present illness.  Exam:  Kim  assistant General: well developed, well nourished female, no acute distress HEENT: normal  Lungs: clear to auscultation without wheezing, rales or rhonchi  Cardiac: regular rate without rubs, murmurs or gallops  Abdomen: soft, nontender without masses, guarding, rebound, organomegaly  Pelvic: external bus vagina: normal   Cervix: grossly normal  was IUD string visualized  Uterus: normal size, midline and mobile, nontender  Adnexa: without masses or tenderness      Assessment/Plan:  47 y.o. G0P0 with breakthrough bleeding 7-8 months with Mirena IUD placed 04/2009. Sonohysterogram suggestive of endometrial polyps.  Discussed options. Feel the most appropriate at this point would be to remove her IUD proceed with hysteroscopy D&C and removal of any intracavitary abnormalities. Options to leave the IUD and continue to observe was also reviewed. Birth control is an issue and alternatives for birth control were reviewed with her to include hormonal/replacement of her IUD/Nexplanon/sterilization to include tubal/essure/vasectomy. Risks benefits/pros and cons of each choice  reviewed. Patient declines sterilization and she is not ready to make that decision at this time. She would prefer to be off of contraception for several months until menses regulate and then make a decision at that time. The need for assured birth control in the interim discussed. She is leaning towards oral contraceptives. Does have a history of smoking although has quit approximately 15 years ago. She is also being followed for hypertension well controlled. Risk of thrombosis to include stroke heart attack DVT and possible increased risk given this history and her age discussed. She does note though since using the IUD she's had increased incidences of recurrent vulvar boils which switching to a low-dose oral contraceptive may be beneficial. We will further discuss postoperatively. What is involved with removal of her IUD, hysteroscopy D&C was reviewed to include the intraoperative postoperative courses and instrumentation including use of the resectoscope and D&C portion of the procedure. The risks of infection prolonged antibiotics hemorrhage necessitating transfusion and the risks of transfusion including transfusion reaction hepatitis HIV mad cow disease and other unknown entities. The risk of uterine perforation, damage to internal organs including bowel bladder ureters vessels and nerves either immediately recognized or delay recognized necessitating major exploratory reparative surgeries and future reparative surgeries including ostomy formation bowel resection bladder repair ureteral damage repair was reviewed with her. Distended media absorption leading to metabolic complications such as coma seizures also discussed.  Patient clearly understands no guarantees as far as relief of her irregular bleeding and that her irregular bleeding may continue worsen or change following the procedure. Patient's questions were answered and she is ready to proceed with surgery.     Dara Lords MD, 4:51 PM  08/04/2012

## 2012-08-04 NOTE — Progress Notes (Signed)
April [name] August 26, 1965 [id number]   Preoperative consult  Chief complaint:  Persistent irregular vaginal bleeding  History of present illness: 47 y.o. G0P0 with history of 7-8 months of spotting on and off almost on a daily basis. Had sonohysterogram 04/21/2012 which showed endometrial polyps low in the cavity and her IUD in the cavity although the right arm was not visualized. Hormone studies to include FSH TSH were normal. Options for management were reviewed and the patient elects for removal of her IUD hysteroscopy D&C and removal of endometrial polyps.     Past medical history,surgical history, medications, allergies, family history and social history were all reviewed and documented in the EPIC chart. ROS:  Was performed and pertinent positives and negatives are included in the history of present illness.  Exam:  Kim  assistant General: well developed, well nourished female, no acute distress HEENT: normal  Lungs: clear to auscultation without wheezing, rales or rhonchi  Cardiac: regular rate without rubs, murmurs or gallops  Abdomen: soft, nontender without masses, guarding, rebound, organomegaly  Pelvic: external bus vagina: normal   Cervix: grossly normal  was IUD string visualized  Uterus: normal size, midline and mobile, nontender  Adnexa: without masses or tenderness      Assessment/Plan:  48 y.o. G0P0 with breakthrough bleeding 7-8 months with Mirena IUD placed 04/2009. Sonohysterogram suggestive of endometrial polyps.  Discussed options. Feel the most appropriate at this point would be to remove her IUD proceed with hysteroscopy D&C and removal of any intracavitary abnormalities. Options to leave the IUD and continue to observe was also reviewed. Birth control is an issue and alternatives for birth control were reviewed with her to include hormonal/replacement of her IUD/Nexplanon/sterilization to include tubal/essure/vasectomy. Risks benefits/pros and cons of each choice  reviewed. Patient declines sterilization and she is not ready to make that decision at this time. She would prefer to be off of contraception for several months until menses regulate and then make a decision at that time. The need for assured birth control in the interim discussed. She is leaning towards oral contraceptives. Does have a history of smoking although has quit approximately 15 years ago. She is also being followed for hypertension well controlled. Risk of thrombosis to include stroke heart attack DVT and possible increased risk given this history and her age discussed. She does note though since using the IUD she's had increased incidences of recurrent vulvar boils which switching to a low-dose oral contraceptive may be beneficial. We will further discuss postoperatively. What is involved with removal of her IUD, hysteroscopy D&C was reviewed to include the intraoperative postoperative courses and instrumentation including use of the resectoscope and D&C portion of the procedure. The risks of infection prolonged antibiotics hemorrhage necessitating transfusion and the risks of transfusion including transfusion reaction hepatitis HIV mad cow disease and other unknown entities. The risk of uterine perforation, damage to internal organs including bowel bladder ureters vessels and nerves either immediately recognized or delay recognized necessitating major exploratory reparative surgeries and future reparative surgeries including ostomy formation bowel resection bladder repair ureteral damage repair was reviewed with her. Distended media absorption leading to metabolic complications such as coma seizures also discussed.  Patient clearly understands no guarantees as far as relief of her irregular bleeding and that her irregular bleeding may continue worsen or change following the procedure. Patient's questions were answered and she is ready to proceed with surgery.   Dara Lords MD, 4:38 PM  08/04/2012

## 2012-08-05 ENCOUNTER — Encounter (HOSPITAL_COMMUNITY): Payer: Self-pay | Admitting: Pharmacist

## 2012-08-10 ENCOUNTER — Encounter (HOSPITAL_COMMUNITY)
Admission: RE | Admit: 2012-08-10 | Discharge: 2012-08-10 | Disposition: A | Discharge status: Home / Self Care | Payer: 59 | Source: Ambulatory Visit | Attending: Gynecology | Admitting: Gynecology

## 2012-08-10 ENCOUNTER — Encounter (HOSPITAL_COMMUNITY): Payer: Self-pay

## 2012-08-10 HISTORY — PX: HYSTEROSCOPY: SHX211

## 2012-08-10 HISTORY — DX: Furuncle of limb, unspecified: L02.429

## 2012-08-10 HISTORY — DX: Headache: R51

## 2012-08-10 LAB — COMPREHENSIVE METABOLIC PANEL
ALT: 16 U/L (ref 0–35)
AST: 19 U/L (ref 0–37)
Albumin: 4 g/dL (ref 3.5–5.2)
Alkaline Phosphatase: 115 U/L (ref 39–117)
Chloride: 98 mEq/L (ref 96–112)
Potassium: 3.8 mEq/L (ref 3.5–5.1)
Total Bilirubin: 0.3 mg/dL (ref 0.3–1.2)

## 2012-08-10 LAB — CBC
MCH: 26.5 pg (ref 26.0–34.0)
MCV: 79.2 fL (ref 78.0–100.0)
Platelets: 308 10*3/uL (ref 150–400)
RDW: 14.9 % (ref 11.5–15.5)

## 2012-08-10 NOTE — Patient Instructions (Signed)
Your procedure is scheduled on:08/13/12  Enter through the Main Entrance at :6am   Pick up desk phone and dial [phone] and inform us of your arrival.  Please call [phone] if you have any problems the morning of surgery.  Remember: Do not eat or drink after midnight: Thursday   Take these meds the morning of surgery with a sip of water:BP med, Claritin  DO NOT wear jewelry, eye make-up, lipstick,body lotion, or dark fingernail polish.  Patients discharged on the day of surgery will not be allowed to drive home.

## 2012-08-10 NOTE — Pre-Procedure Instructions (Signed)
PCR done because pt has boil on left thigh- in healing process now- no drainage.

## 2012-08-12 MED ORDER — CLINDAMYCIN PHOSPHATE 900 MG/50ML IV SOLN
900.0000 mg | INTRAVENOUS | Status: AC
  Administered 2012-08-13: 900 mg via INTRAVENOUS
  Filled 2012-08-12: qty 50

## 2012-08-12 MED ORDER — CIPROFLOXACIN IN D5W 400 MG/200ML IV SOLN
400.0000 mg | INTRAVENOUS | Status: AC
  Administered 2012-08-13: 400 mg via INTRAVENOUS
  Filled 2012-08-12: qty 200

## 2012-08-13 ENCOUNTER — Ambulatory Visit (HOSPITAL_COMMUNITY): Payer: 59

## 2012-08-13 ENCOUNTER — Encounter (HOSPITAL_COMMUNITY): Payer: Self-pay | Admitting: Anesthesiology

## 2012-08-13 ENCOUNTER — Encounter (HOSPITAL_COMMUNITY): Payer: Self-pay

## 2012-08-13 ENCOUNTER — Encounter (HOSPITAL_COMMUNITY)
Admission: RE | Disposition: A | Discharge status: Home / Self Care | Payer: Self-pay | Source: Ambulatory Visit | Attending: Gynecology

## 2012-08-13 ENCOUNTER — Ambulatory Visit (HOSPITAL_COMMUNITY)
Admission: RE | Admit: 2012-08-13 | Discharge: 2012-08-13 | Disposition: A | Discharge status: Home / Self Care | Payer: 59 | Source: Ambulatory Visit | Attending: Gynecology | Admitting: Gynecology

## 2012-08-13 DIAGNOSIS — N938 Other specified abnormal uterine and vaginal bleeding: Secondary | ICD-10-CM | POA: Insufficient documentation

## 2012-08-13 DIAGNOSIS — N925 Other specified irregular menstruation: Secondary | ICD-10-CM | POA: Insufficient documentation

## 2012-08-13 DIAGNOSIS — N949 Unspecified condition associated with female genital organs and menstrual cycle: Secondary | ICD-10-CM

## 2012-08-13 DIAGNOSIS — N840 Polyp of corpus uteri: Secondary | ICD-10-CM | POA: Insufficient documentation

## 2012-08-13 DIAGNOSIS — Z30432 Encounter for removal of intrauterine contraceptive device: Secondary | ICD-10-CM | POA: Insufficient documentation

## 2012-08-13 HISTORY — PX: IUD REMOVAL: SHX5392

## 2012-08-13 SURGERY — DILATATION & CURETTAGE/HYSTEROSCOPY WITH TRUCLEAR
Anesthesia: General | Site: Vagina | Wound class: Clean Contaminated

## 2012-08-13 MED ORDER — FENTANYL CITRATE 0.05 MG/ML IJ SOLN: 25.0000 ug | INTRAMUSCULAR | Status: DC | PRN

## 2012-08-13 MED ORDER — LIDOCAINE HCL 1 % IJ SOLN
INTRAMUSCULAR | Status: DC | PRN
  Administered 2012-08-13: 10 mL

## 2012-08-13 MED ORDER — LIDOCAINE HCL 2 % IJ SOLN
INTRAMUSCULAR | Status: AC
  Filled 2012-08-13: qty 20

## 2012-08-13 MED ORDER — PROPOFOL 10 MG/ML IV EMUL
INTRAVENOUS | Status: AC
  Filled 2012-08-13: qty 20

## 2012-08-13 MED ORDER — FENTANYL CITRATE 0.05 MG/ML IJ SOLN
INTRAMUSCULAR | Status: AC
  Filled 2012-08-13: qty 4

## 2012-08-13 MED ORDER — ONDANSETRON HCL 4 MG/2ML IJ SOLN
INTRAMUSCULAR | Status: AC
  Filled 2012-08-13: qty 2

## 2012-08-13 MED ORDER — SODIUM CHLORIDE 0.9 % IR SOLN
Status: DC | PRN
  Administered 2012-08-13: 3000 mL

## 2012-08-13 MED ORDER — MIDAZOLAM HCL 2 MG/2ML IJ SOLN
INTRAMUSCULAR | Status: AC
  Filled 2012-08-13: qty 2

## 2012-08-13 MED ORDER — ONDANSETRON HCL 4 MG/2ML IJ SOLN
INTRAMUSCULAR | Status: DC | PRN
  Administered 2012-08-13 (×2): 4 mg via INTRAVENOUS

## 2012-08-13 MED ORDER — PROPOFOL 10 MG/ML IV EMUL
INTRAVENOUS | Status: DC | PRN
  Administered 2012-08-13: 150 mg via INTRAVENOUS

## 2012-08-13 MED ORDER — DEXTROSE-NACL 5-0.9 % IV SOLN: INTRAVENOUS | Status: DC

## 2012-08-13 MED ORDER — FENTANYL CITRATE 0.05 MG/ML IJ SOLN
INTRAMUSCULAR | Status: DC | PRN
  Administered 2012-08-13: 25 ug via INTRAVENOUS
  Administered 2012-08-13: 50 ug via INTRAVENOUS
  Administered 2012-08-13: 25 ug via INTRAVENOUS

## 2012-08-13 MED ORDER — KETOROLAC TROMETHAMINE 30 MG/ML IJ SOLN
INTRAMUSCULAR | Status: AC
  Filled 2012-08-13: qty 1

## 2012-08-13 MED ORDER — DEXAMETHASONE SODIUM PHOSPHATE 4 MG/ML IJ SOLN
INTRAMUSCULAR | Status: DC | PRN
  Administered 2012-08-13: 4 mg via INTRAVENOUS

## 2012-08-13 MED ORDER — KETOROLAC TROMETHAMINE 30 MG/ML IJ SOLN
INTRAMUSCULAR | Status: DC | PRN
  Administered 2012-08-13: 30 mg via INTRAMUSCULAR

## 2012-08-13 MED ORDER — MIDAZOLAM HCL 5 MG/5ML IJ SOLN
INTRAMUSCULAR | Status: DC | PRN
  Administered 2012-08-13: 2 mg via INTRAVENOUS

## 2012-08-13 MED ORDER — LIDOCAINE HCL (CARDIAC) 20 MG/ML IV SOLN
INTRAVENOUS | Status: AC
  Filled 2012-08-13: qty 5

## 2012-08-13 MED ORDER — LIDOCAINE HCL (CARDIAC) 20 MG/ML IV SOLN
INTRAVENOUS | Status: DC | PRN
  Administered 2012-08-13: 80 mg via INTRAVENOUS

## 2012-08-13 MED ORDER — HYDROCODONE-ACETAMINOPHEN 5-325 MG PO TABS: 1.0000 | ORAL_TABLET | Freq: Four times a day (QID) | ORAL | Status: DC | PRN

## 2012-08-13 MED ORDER — DEXAMETHASONE SODIUM PHOSPHATE 10 MG/ML IJ SOLN
INTRAMUSCULAR | Status: AC
  Filled 2012-08-13: qty 1

## 2012-08-13 MED ORDER — LACTATED RINGERS IV SOLN
INTRAVENOUS | Status: DC
  Administered 2012-08-13 (×3): via INTRAVENOUS

## 2012-08-13 SURGICAL SUPPLY — 21 items
BLADE INCISOR TRUC PLUS 2.9 (ABLATOR) IMPLANT
CANISTERS HI-FLOW 3000CC (CANNISTER) ×2 IMPLANT
CATH ROBINSON RED A/P 16FR (CATHETERS) ×2 IMPLANT
CLOTH BEACON ORANGE TIMEOUT ST (SAFETY) ×2 IMPLANT
CONTAINER PREFILL 10% NBF 60ML (FORM) ×4 IMPLANT
DRAPE HYSTEROSCOPY (DRAPE) ×2 IMPLANT
DRESSING TELFA 8X3 (GAUZE/BANDAGES/DRESSINGS) ×2 IMPLANT
ELECT REM PT RETURN 9FT ADLT (ELECTROSURGICAL)
ELECTRODE REM PT RTRN 9FT ADLT (ELECTROSURGICAL) IMPLANT
GLOVE BIO SURGEON STRL SZ7.5 (GLOVE) ×2 IMPLANT
GOWN STRL REIN XL XLG (GOWN DISPOSABLE) ×4 IMPLANT
INCISOR TRUC PLUS BLADE 2.9 (ABLATOR) ×2
KIT HYSTEROSCOPY TRUCLEAR (ABLATOR) ×1 IMPLANT
MORCELLATOR RECIP TRUCLEAR 4.0 (ABLATOR) IMPLANT
NDL SPNL 22GX3.5 QUINCKE BK (NEEDLE) IMPLANT
NEEDLE SPNL 22GX3.5 QUINCKE BK (NEEDLE) ×2 IMPLANT
PACK VAGINAL MINOR WOMEN LF (CUSTOM PROCEDURE TRAY) ×2 IMPLANT
PAD OB MATERNITY 4.3X12.25 (PERSONAL CARE ITEMS) ×2 IMPLANT
SYR CONTROL 10ML LL (SYRINGE) ×2 IMPLANT
TOWEL OR 17X24 6PK STRL BLUE (TOWEL DISPOSABLE) ×4 IMPLANT
WATER STERILE IRR 1000ML POUR (IV SOLUTION) ×2 IMPLANT

## 2012-08-13 NOTE — Anesthesia Postprocedure Evaluation (Signed)
Anesthesia Post Note  Patient: April [name]  Procedure(s) Performed: Procedure(s) (LRB): DILATATION & CURETTAGE/HYSTEROSCOPY WITH TRUCLEAR, REMOVAL OF IUD (N/A) INTRAUTERINE DEVICE (IUD) REMOVAL (N/A)  Anesthesia type: General  Patient location: PACU  Post pain: Pain level controlled  Post assessment: Post-op Vital signs reviewed  Last Vitals:  Filed Vitals:   08/13/12 0805  BP:   Pulse: 68  Temp: 36.8 C  Resp:     Post vital signs: Reviewed  Level of consciousness: sedated  Complications: No apparent anesthesia complications

## 2012-08-13 NOTE — Transfer of Care (Addendum)
Immediate Anesthesia Transfer of Care Note  Patient: Shemekia Patane  Procedure(s) Performed: Procedure(s): DILATATION & CURETTAGE/HYSTEROSCOPY WITH TRUCLEAR, REMOVAL OF IUD (N/A) INTRAUTERINE DEVICE (IUD) REMOVAL (N/A)  Patient Location: PACU  Anesthesia Type:General  Level of Consciousness: awake, alert , oriented and patient cooperative  Airway & Oxygen Therapy: Patient Spontanous Breathing and Patient connected to nasal cannula oxygen  Post-op Assessment: Report given to PACU RN and Post -op Vital signs reviewed and stable  Post vital signs: Reviewed and stable  Complications: No apparent anesthesia complications

## 2012-08-13 NOTE — Preoperative (Signed)
Beta Blockers   Reason not to administer Beta Blockers:Hold beta blocker due to other;patient took medication as prescribed this a.m.

## 2012-08-13 NOTE — Op Note (Signed)
April [name] 12-27-1965 [id number]   Post Operative Note   Date of surgery:  08/13/2012  Pre Op Dx:  Irregular vaginal bleeding, endometrial polyps  Post Op Dx:  Same  Procedure:  Hysteroscopy D&C, Truclear resection of endometrial polyps, removal of Mirena IUD  Surgeon:  Colin Broach P  Anesthesia:  General  EBL:  Minimal  Distended media discrepancy:  Less than 300 cc saline   Complications:  None  Specimen:  #1 Mirena IUD, gross only #2 endometrial polyps #3 endometrial curetting to pathology  Findings: EUA:  External BUS vagina normal. Cervix normal with IUD string visualized. Uterus grossly normal size midline mobile. Adnexa without masses   Hysteroscopy: Multiple small endometrial polyps lower uterine segment, all resected completely to the level of the surrounding endometrium. Endometrial cavity otherwise normal noting fundus, anterior/posterior uterine surfaces, lower uterine segment, endocervical canal, right and left tubal ostia all visualized  Procedure:  The patient was taken to the operating room, underwent general anesthesia, was placed in the low dorsal lithotomy position, received a perineal/vaginal preparation with Betadine solution per nursing personnel and the bladder was emptied with in and out Foley catheterization.  The timeout was performed by the surgical team and the patient was draped in the usual fashion. The cervix was visualized with a speculum, anterior lip grasped with a single-tooth tenaculum and a paracervical block using 1% lidocaine was placed, a total of 10 cc. The Mirena IUD string was then grasped and the IUD was removed and sent to pathology. The cervix was gently dilated to admit the 5 mm Truclear hysteroscope and hysteroscopy was performed with findings noted above. Using the small Truclear polyp morcellator all endometrial polyps were resected to the level of the surrounding endometrium. The specimen was sent to pathology. A sharp curettage was  then performed and the specimen was sent separately to pathology. Repeat hysteroscopy showed good distention, an empty endometrial cavity and no evidence of perforation. The instruments were removed, hemostasis visualized at the tenaculum site and external cervical os and the patient was placed in the supine position, received intraoperative Toradol, was awakened without difficulty and taken to the recovery room in good condition having tolerated the procedure well. Reported distended discrepancy 300 cc although a fair amount of spillage was noted on the floor.  Dara Lords MD, 8:25 AM 08/13/2012

## 2012-08-13 NOTE — Anesthesia Preprocedure Evaluation (Addendum)
Anesthesia Evaluation  Patient identified by MRN, date of birth, ID band Patient awake    Reviewed: Allergy & Precautions, H&P , Patient's Chart, lab work & pertinent test results, reviewed documented beta blocker date and time   Airway Mallampati: II TM Distance: >3 FB Neck ROM: full    Dental no notable dental hx.    Pulmonary  breath sounds clear to auscultation  Pulmonary exam normal       Cardiovascular hypertension, On Medications Rhythm:regular Rate:Normal     Neuro/Psych    GI/Hepatic   Endo/Other    Renal/GU      Musculoskeletal   Abdominal   Peds  Hematology   Anesthesia Other Findings   Reproductive/Obstetrics                          Anesthesia Physical Anesthesia Plan  ASA: III  Anesthesia Plan: General   Post-op Pain Management:    Induction: Intravenous  Airway Management Planned: LMA  Additional Equipment:   Intra-op Plan:   Post-operative Plan:   Informed Consent: I have reviewed the patients History and Physical, chart, labs and discussed the procedure including the risks, benefits and alternatives for the proposed anesthesia with the patient or authorized representative who has indicated his/her understanding and acceptance.   Dental Advisory Given  Plan Discussed with: CRNA and Surgeon  Anesthesia Plan Comments: (  Discussed  general anesthesia, including possible nausea, instrumentation of airway, sore throat,pulmonary aspiration, etc. I asked if the were any outstanding questions, or  concerns before we proceeded. )        Anesthesia Quick Evaluation

## 2012-08-13 NOTE — H&P (Signed)
  The patient was examined.  I reviewed the proposed surgery and consent form with the patient.  The dictated history and physical is current and accurate and all questions were answered. The patient is ready to proceed with surgery and has a realistic understanding and expectation for the outcome.   Dara Lords MD, 7:14 AM 08/13/2012

## 2012-08-16 ENCOUNTER — Encounter (HOSPITAL_COMMUNITY): Payer: Self-pay | Admitting: Gynecology

## 2012-08-26 ENCOUNTER — Ambulatory Visit (INDEPENDENT_AMBULATORY_CARE_PROVIDER_SITE_OTHER): Payer: 59 | Admitting: Gynecology

## 2012-08-26 ENCOUNTER — Encounter: Payer: Self-pay | Admitting: Gynecology

## 2012-08-26 DIAGNOSIS — Z9889 Other specified postprocedural states: Secondary | ICD-10-CM

## 2012-08-26 NOTE — Progress Notes (Signed)
Patient presents 2 weeks postoperative from hysteroscopy D&C removal of endometrial polyps and her Mirena IUD. She's done some bleeding on and off almost daily since then. No cramping pain fever chills or other symptoms.  Exam with Kim assistant Abdomen soft nontender without masses guarding rebound organomegaly. Pelvic external BUS vagina with dark brown staining. Cervix normal. Uterus normal sized mobile nontender. Adnexa without masses or tenderness.  Assessment and plan: Status post hysteroscopy D&C removal of endometrial polyps and Mirena IUD. Pathology showed benign endometrial polyps. Patient plans expectant management for 3 months using condom birth control until she regulate her menses at her choice. We discussed birth control options and she was interested in a low dose oral contraceptive. She is being treated for hypertension well controlled. Also has a smoking history although stopped over 14 years ago. The issues of increased risk with age, hypertension and past smoking reviewed. The issues as to how far out from smoking would be considered safe if at all ever to use the pills was also discussed. She would accept the risk of stroke heart attack DVT then I would consider using a low dose 10 mcg BCP and will further discuss after being off of everything for several months to see how she's doing. I did give her 2 sample cards #10 total of Premarin 0.625 to start now to see if this will accelerate her endometrial healing to stop her bleeding. I did review the issues again of thrombosis with estrogen.

## 2012-08-26 NOTE — Patient Instructions (Signed)
Followup in several months with contraceptive decision. Sooner if any issues or bleeding continues irregularly.

## 2012-09-13 ENCOUNTER — Telehealth: Payer: Self-pay | Admitting: *Deleted

## 2012-09-13 MED ORDER — NORETHIN-ETH ESTRAD-FE BIPHAS 1 MG-10 MCG / 10 MCG PO TABS: 1.0000 | ORAL_TABLET | Freq: Every day | ORAL | Status: DC

## 2012-09-13 NOTE — Telephone Encounter (Signed)
Pt informed with the below note, rx sent. 

## 2012-09-13 NOTE — Telephone Encounter (Signed)
Pt is postoperative from hysteroscopy D&C removal of endometrial polyps and her Mirena IUD called and said she is ready to start on birth control pills, she is no longer smoking and was told to call when ready to start. Please advise

## 2012-09-13 NOTE — Telephone Encounter (Signed)
LoLoestrin for 3 months.  Call in follow up to see how she is doing.

## 2012-11-15 ENCOUNTER — Other Ambulatory Visit: Payer: Self-pay | Admitting: Gynecology

## 2012-12-20 ENCOUNTER — Telehealth: Payer: Self-pay

## 2012-12-20 NOTE — Telephone Encounter (Signed)
correct 

## 2012-12-20 NOTE — Telephone Encounter (Signed)
Patient informed. 

## 2012-12-20 NOTE — Telephone Encounter (Signed)
Had D&C, Hysteroscopy and IUD removal on 08/13/12.  She has just finished her 3rd pack of OC's and not any bleeding this pack. She said the first two packs she had like at normal period her week off the pill. This pack not even a spot of blood. She re-started a new pack today but wanted to check and see if this is okay.  She had no missed pills and was half day late taking one pill but took as soon as she remembered and kept on normal schedule.  I told her it is okay not to have withdrawal bleeding on the pill and the most important thing is to make sure she is not pregnant.  I told her that you would probably want her to check a home UPT before she takes this pack of pills.   Please advise.

## 2013-01-24 ENCOUNTER — Encounter: Payer: Self-pay | Admitting: Family Medicine

## 2013-01-24 ENCOUNTER — Ambulatory Visit (INDEPENDENT_AMBULATORY_CARE_PROVIDER_SITE_OTHER): Payer: 59 | Admitting: Family Medicine

## 2013-01-24 VITALS — BP 110/70 | HR 68 | Temp 98.2°F | Wt 230.0 lb

## 2013-01-24 DIAGNOSIS — R1013 Epigastric pain: Secondary | ICD-10-CM

## 2013-01-24 DIAGNOSIS — R7401 Elevation of levels of liver transaminase levels: Secondary | ICD-10-CM

## 2013-01-24 DIAGNOSIS — R300 Dysuria: Secondary | ICD-10-CM

## 2013-01-24 DIAGNOSIS — R197 Diarrhea, unspecified: Secondary | ICD-10-CM

## 2013-01-24 DIAGNOSIS — R112 Nausea with vomiting, unspecified: Secondary | ICD-10-CM

## 2013-01-24 LAB — CBC WITH DIFFERENTIAL/PLATELET
Basophils Absolute: 0 10*3/uL (ref 0.0–0.1)
Eosinophils Absolute: 0.5 10*3/uL (ref 0.0–0.7)
Eosinophils Relative: 5.4 % — ABNORMAL HIGH (ref 0.0–5.0)
HCT: 40.2 % (ref 36.0–46.0)
Lymphs Abs: 2.3 10*3/uL (ref 0.7–4.0)
MCHC: 33.6 g/dL (ref 30.0–36.0)
MCV: 78.6 fl (ref 78.0–100.0)
Monocytes Absolute: 0.7 10*3/uL (ref 0.1–1.0)
Neutrophils Relative %: 63.6 % (ref 43.0–77.0)
Platelets: 263 10*3/uL (ref 150.0–400.0)
RDW: 14.4 % (ref 11.5–14.6)
WBC: 10 10*3/uL (ref 4.5–10.5)

## 2013-01-24 LAB — POCT URINALYSIS DIPSTICK
Glucose, UA: NEGATIVE
Ketones, UA: NEGATIVE
Leukocytes, UA: NEGATIVE
Nitrite, UA: NEGATIVE
pH, UA: 6.5

## 2013-01-24 LAB — HEPATIC FUNCTION PANEL
Bilirubin, Direct: 0 mg/dL (ref 0.0–0.3)
Total Bilirubin: 0.5 mg/dL (ref 0.3–1.2)

## 2013-01-24 LAB — AMYLASE: Amylase: 33 U/L (ref 27–131)

## 2013-01-24 LAB — BASIC METABOLIC PANEL
BUN: 11 mg/dL (ref 6–23)
Chloride: 94 mEq/L — ABNORMAL LOW (ref 96–112)
Creatinine, Ser: 0.9 mg/dL (ref 0.4–1.2)
Glucose, Bld: 105 mg/dL — ABNORMAL HIGH (ref 70–99)

## 2013-01-24 MED ORDER — ONDANSETRON 8 MG PO TBDP: 8.0000 mg | ORAL_TABLET | Freq: Three times a day (TID) | ORAL | Status: DC | PRN

## 2013-01-24 NOTE — Progress Notes (Addendum)
Subjective:    Patient ID: April [name], female    DOB: [birth year], 47 y.o.   MRN: [id number]  HPI Patient seen as a work in with onset of acute illness one week ago. She developed last week some vomiting on Monday and Tuesday along with nonbloody diarrhea. Her vomiting lasted for 2 days. By Friday of last week she was feeling better -then Sunday she spiked fever 103.6 and had recurrence of vomiting and diarrhea. Since then, she's had about 2-3 watery nonbloody diarrhea stools per day. She took some Imodium yesterday and has only had one loose stool a day.  Patient denies any recent travels. No recent antibiotics. She has some persistent low-grade nausea. She's had some dull mid epigastric abdominal pains. No radiation. Previous cholecystectomy. Over the weekend she noticed that her urine looked very darkly colored. No flank pain. No jaundice. Denies any recent sick contacts.  Past Medical History  Diagnosis Date  . Bite from cat     secondary infection right hand sugery 10/08  . Hypertension   . Hernia   . Dysrhythmia     palpitations (sporadic)  . Anemia     in the past  . Fibromyalgia   . Headache(784.0)     migraines  . Boil, thigh     recurrent boils of thighs- per pt Dr. Audie Box says from hormones   Past Surgical History  Procedure Laterality Date  . Tonsillectomy    . Pilonidal cyst / sinus excision    . Hand surgery      Cat Bite trauma  . Cholecystectomy  08/12/2011    Procedure: LAPAROSCOPIC CHOLECYSTECTOMY WITH INTRAOPERATIVE CHOLANGIOGRAM;  Surgeon: Ernestene Mention, MD;  Location: [facility];  Service: General;  Laterality: N/A;  . Incisional hernia repair  04/28/2012    Procedure: HERNIA REPAIR INCISIONAL;  Surgeon: Ernestene Mention, MD;  Location: [facility];  Service: General;  Laterality: N/A;  . Insertion of mesh  04/28/2012    Procedure: INSERTION OF MESH;  Surgeon: Ernestene Mention, MD;  Location: [facility];  Service: General;  Laterality: N/A;  . Iud removal N/A 08/13/2012     Procedure: INTRAUTERINE DEVICE (IUD) REMOVAL;  Surgeon: Dara Lords, MD;  Location: [facility];  Service: Gynecology;  Laterality: N/A;  . Hysteroscopy  08/2012    polyps    reports that she quit smoking about 14 years ago. She has never used smokeless tobacco. She reports that  drinks alcohol. She reports that she does not use illicit drugs. family history includes Breast cancer in her maternal grandmother; Colon cancer in her father and paternal grandfather; Diabetes in her father; Heart disease in her mother; Hypertension in her paternal grandmother. Allergies  Allergen Reactions  . Amoxicillin Hives  . Penicillins Hives      Review of Systems  Constitutional: Positive for chills, appetite change and fatigue.  Respiratory: Negative for cough and shortness of breath.   Cardiovascular: Negative for chest pain.  Gastrointestinal: Positive for nausea, abdominal pain and diarrhea. Negative for constipation and blood in stool.  Endocrine: Negative for polydipsia and polyuria.  Genitourinary: Positive for dysuria.  Neurological: Positive for dizziness and weakness. Negative for syncope.       Objective:   Physical Exam  Constitutional: She is oriented to person, place, and time. She appears well-developed and well-nourished.  HENT:  Right Ear: External ear normal.  Left Ear: External ear normal.  Mouth/Throat: Oropharynx is clear and moist.  Neck: Neck supple.  Cardiovascular: Normal rate  and regular rhythm.   Pulmonary/Chest: Effort normal and breath sounds normal. No respiratory distress. She has no wheezes. She has no rales.  Abdominal: Soft. Bowel sounds are normal. She exhibits no distension and no mass. There is no rebound and no guarding.  Very minimal epigastric tenderness.  Musculoskeletal: She exhibits no edema.  Lymphadenopathy:    She has no cervical adenopathy.  Neurological: She is alert and oriented to person, place, and time.  Skin: No rash noted.           Assessment & Plan:  Patient presents with intermittent symptoms of nausea, vomiting, and diarrhea over the past one week. She describes singular episode of fever last Saturday 103.6 but none since then. We explained her persistent vomiting over this period would not be typical of most viral illness. Check further labs with urinalysis, CBC, amylase, basic metabolic panel, hepatic panel. She is nontoxic in appearance at this time and keeping down fluids in office.  Elevated liver transaminases. I will set up abdominal ultrasound to further assess.

## 2013-01-24 NOTE — Addendum Note (Signed)
Addended by: Kristian Covey on: 01/24/2013 10:35 PM   Modules accepted: Orders

## 2013-01-26 ENCOUNTER — Ambulatory Visit
Admission: RE | Admit: 2013-01-26 | Discharge: 2013-01-26 | Disposition: A | Discharge status: Home / Self Care | Payer: 59 | Source: Ambulatory Visit | Attending: Family Medicine | Admitting: Family Medicine

## 2013-01-26 ENCOUNTER — Other Ambulatory Visit: Payer: Self-pay

## 2013-01-26 DIAGNOSIS — R112 Nausea with vomiting, unspecified: Secondary | ICD-10-CM

## 2013-01-26 DIAGNOSIS — Z1231 Encounter for screening mammogram for malignant neoplasm of breast: Secondary | ICD-10-CM

## 2013-01-26 DIAGNOSIS — R1013 Epigastric pain: Secondary | ICD-10-CM

## 2013-01-26 NOTE — Addendum Note (Signed)
Addended by: Kristian Covey on: 01/26/2013 05:45 PM   Modules accepted: Orders

## 2013-01-27 ENCOUNTER — Other Ambulatory Visit (INDEPENDENT_AMBULATORY_CARE_PROVIDER_SITE_OTHER): Payer: 59

## 2013-01-27 DIAGNOSIS — R7401 Elevation of levels of liver transaminase levels: Secondary | ICD-10-CM

## 2013-01-28 ENCOUNTER — Telehealth: Payer: Self-pay | Admitting: Family Medicine

## 2013-01-28 DIAGNOSIS — R7401 Elevation of levels of liver transaminase levels: Secondary | ICD-10-CM

## 2013-01-28 LAB — HEPATIC FUNCTION PANEL
AST: 55 U/L — ABNORMAL HIGH (ref 0–37)
Albumin: 3.7 g/dL (ref 3.5–5.2)
Alkaline Phosphatase: 132 U/L — ABNORMAL HIGH (ref 39–117)
Total Bilirubin: 0.2 mg/dL — ABNORMAL LOW (ref 0.3–1.2)

## 2013-01-28 NOTE — Telephone Encounter (Signed)
Spoke with pt.  She was feeling better until today when she had recurrent nausea but no vomiting. No fever.  Liver transaminases are trending down from Monday. I discussed with pt GI refer vs possible MRCP and will proceed with the former.

## 2013-01-28 NOTE — Telephone Encounter (Signed)
Pt saw Dr Caryl Never  Monday. Pt would like you to call her w/ lab results from Monday. Pt states she feels awlful, and is actually worse. Pls call

## 2013-01-31 ENCOUNTER — Telehealth: Payer: Self-pay | Admitting: Family Medicine

## 2013-01-31 NOTE — Telephone Encounter (Signed)
Patient Information:  Caller Name: Lillian  Phone: [phone]  Patient: April [name]  Gender: Female  DOB: March 15, 1966  Age: 47 Years  PCP: Evelena Peat [facility])  Pregnant: No  Office Follow Up:  Does the office need to follow up with this patient?: Yes  Instructions For The Office: Please call patient with referral information   Symptoms  Reason For Call & Symptoms: Calling to see if Referral to Gastroenterology has been made. Dr. Caryl Never indicated that he wanted her to be seen this week. She has questions about the MRCP. Information Given per [email or web address]  Reviewed Health History In EMR: Yes  Reviewed Medications In EMR: Yes  Reviewed Allergies In EMR: Yes  Reviewed Surgeries / Procedures: Yes  Date of Onset of Symptoms: 01/31/2013 OB / GYN:  LMP: Unknown  Guideline(s) Used:  No Protocol Available - Information Only  Disposition Per Guideline:   Home Care  Reason For Disposition Reached:   Information only question and nurse able to answer  Advice Given:  Call Back If:  New symptoms develop  You become worse.  Patient Will Follow Care Advice:  YES

## 2013-02-01 ENCOUNTER — Telehealth: Payer: Self-pay | Admitting: Gastroenterology

## 2013-02-01 NOTE — Telephone Encounter (Signed)
Pt is scheduled 02/02/13 @ 10:00. Waiting on call back from pt.

## 2013-02-01 NOTE — Telephone Encounter (Signed)
Patient is scheduled to see Jessica Zehr, PA tomorrow at 10:00 

## 2013-02-02 ENCOUNTER — Other Ambulatory Visit (INDEPENDENT_AMBULATORY_CARE_PROVIDER_SITE_OTHER): Payer: 59

## 2013-02-02 ENCOUNTER — Encounter: Payer: Self-pay | Admitting: Gastroenterology

## 2013-02-02 ENCOUNTER — Ambulatory Visit (INDEPENDENT_AMBULATORY_CARE_PROVIDER_SITE_OTHER): Payer: 59 | Admitting: Gastroenterology

## 2013-02-02 VITALS — BP 122/76 | HR 86 | Ht 70.0 in | Wt 227.8 lb

## 2013-02-02 DIAGNOSIS — R7989 Other specified abnormal findings of blood chemistry: Secondary | ICD-10-CM

## 2013-02-02 DIAGNOSIS — B349 Viral infection, unspecified: Secondary | ICD-10-CM

## 2013-02-02 DIAGNOSIS — B9789 Other viral agents as the cause of diseases classified elsewhere: Secondary | ICD-10-CM

## 2013-02-02 LAB — HEPATIC FUNCTION PANEL
ALT: 61 U/L — ABNORMAL HIGH (ref 0–35)
AST: 42 U/L — ABNORMAL HIGH (ref 0–37)
Albumin: 3.6 g/dL (ref 3.5–5.2)
Alkaline Phosphatase: 142 U/L — ABNORMAL HIGH (ref 39–117)
Total Bilirubin: 0.6 mg/dL (ref 0.3–1.2)

## 2013-02-02 LAB — HEPATITIS B SURFACE ANTIBODY,QUALITATIVE: Hep B S Ab: NEGATIVE

## 2013-02-02 NOTE — Progress Notes (Signed)
i agree with the plan noted above 

## 2013-02-02 NOTE — Progress Notes (Signed)
Reviewed and agree with management plan.  Fatimata Talsma T. Arly Salminen, MD FACG 

## 2013-02-02 NOTE — Progress Notes (Signed)
02/02/2013 April [name] [id number] 09-Jun-1965   History of Present Illness:  Patient is a pleasant 47 year old female who is a patient of Dr. Ardell Isaacs.  She presents to our office today regarding a recent issue with elevated LFT's.  She says that a couple of weeks ago she had sudden onset of nausea, vomiting, diarrhea, fevers, and upper abdominal pain.  She saw her PCP who ordered an ultrasound, which showed mild hepatomegaly, s/p cholecystectomy, right hepatic lobe hemangioma, but no other abnormalities; CBD was normal.  CBC was normal.  LFT's were newly elevated with ALP 172, AST 106, ALT 94, and normal total bili.  Her symptoms stuttered on and off for several days, but now she is feeling better with no symptoms since last Friday.  Repeat LFT's 3 days later (onw 6 days ago) showed persistent elevation but with some improvement.  ALP 132, AST 55, ALT 80, and normal total bili.  LFT's were always completely normal in the past.     Current Medications, Allergies, Past Medical History, Past Surgical History, Family History and Social History were reviewed in [employer] record.   Physical Exam: BP 122/76  Pulse 86  Ht 5\' 10"  (1.778 m)  Wt 227 lb 12.8 oz (103.329 kg)  BMI 32.69 kg/m2  SpO2 98%  LMP 01/29/2013 General: Well developed, white female in no acute distress Head: Normocephalic and atraumatic Eyes:  sclerae anicteric, conjunctiva pink  Ears: Normal auditory acuity Lungs: Clear throughout to auscultation Heart: Regular rate and rhythm Abdomen: Soft, non-distended. No masses, no hepatomegaly. Normal bowel sounds.  Non-tender. Musculoskeletal: Symmetrical with no gross deformities  Extremities: No edema  Neurological: Alert oriented x 4, grossly nonfocal Psychological:  Alert and cooperative. Normal mood and affect  Assessment and Recommendations: -Newly/acutely elevated LFT's in the setting of what sounds like an acute viral illness:  No gallbladder present and  bile duct looks normal on ultrasound; mild hepatomegaly.  Will check acute viral hepatitis panel; ? Acute hepatitis A.  Repeat LFT's.

## 2013-02-02 NOTE — Patient Instructions (Addendum)
Please go to the basement level to have your labs drawn.  We will call you with the lab results.  

## 2013-02-03 LAB — HEPATITIS A ANTIBODY, TOTAL: Hep A Total Ab: NEGATIVE

## 2013-02-04 ENCOUNTER — Telehealth: Payer: Self-pay | Admitting: Gastroenterology

## 2013-02-04 DIAGNOSIS — R7989 Other specified abnormal findings of blood chemistry: Secondary | ICD-10-CM

## 2013-02-04 NOTE — Telephone Encounter (Signed)
Pt calling for lab results. Please advise. 

## 2013-02-07 NOTE — Telephone Encounter (Signed)
Patient given results. She states she had another episode of vomiting and diarrhea on Saturday. No fever and pain is less than it was.

## 2013-02-07 NOTE — Telephone Encounter (Signed)
Her viral Hepatitis labs were all negative.  LFT's were still elevated but down slightly more from last time.  I am going to speak with Dr. Russella Dar and see how he wants to proceed from here.  We will get back with her later today or tomorrow about the plan.  Has she experienced any more pain??  Thanks,  [facility]

## 2013-02-08 NOTE — Telephone Encounter (Signed)
Dr. Russella Dar,  Will you please review this patient's chart and let me know what else you would like me to do regarding her elevated LFT's and symptoms.  Not having as much abdominal pain but had another episode of vomiting and diarrhea on Saturday.  Unsure if her symptoms are related to her elevated LFT's, but we presumed that they were because they were all new occurrences.  Let me know what your thoughts are so I can contact her back.  Thank you,  Jess

## 2013-02-08 NOTE — Telephone Encounter (Signed)
Would sent monospot, CMV Ab, ceruloplasmin, fe/tibc, alpha 1 antitrypsin, AMA, ANA, ASMA

## 2013-02-09 ENCOUNTER — Other Ambulatory Visit (INDEPENDENT_AMBULATORY_CARE_PROVIDER_SITE_OTHER): Payer: 59

## 2013-02-09 DIAGNOSIS — R7989 Other specified abnormal findings of blood chemistry: Secondary | ICD-10-CM

## 2013-02-09 LAB — IRON AND TIBC
%SAT: 10 % — ABNORMAL LOW (ref 20–55)
Iron: 53 ug/dL (ref 42–145)
TIBC: 556 ug/dL — ABNORMAL HIGH (ref 250–470)
UIBC: 503 ug/dL — ABNORMAL HIGH (ref 125–400)

## 2013-02-09 NOTE — Telephone Encounter (Signed)
Please send patient for additional labs:  EBV IgM, EBV DNA by PCR, CMV IgM, CMV DNA by PCR, ferritin and TIBC, ceruloplasmin, AMA, ANA, ASMA, celiac (Ttg IgA, and total IgA), alpha 1-antitrypsin, and repeat LFT's.  Thank you,  Jess

## 2013-02-09 NOTE — Telephone Encounter (Signed)
Labs in EPIC. Patient notified and she will come for labs. 

## 2013-02-10 LAB — MITOCHONDRIAL (M2) ANTIBODY: Mitochondrial Ab: 11.4 Units (ref 0.0–20.0)

## 2013-02-10 LAB — HEPATIC FUNCTION PANEL
ALT: 41 U/L — ABNORMAL HIGH (ref 0–35)
AST: 36 U/L (ref 0–37)
Albumin: 4 g/dL (ref 3.5–5.2)
Alkaline Phosphatase: 107 U/L (ref 39–117)

## 2013-02-10 LAB — CMV IGM: CMV IgM: <8 AU/mL (ref <30.00)

## 2013-02-10 LAB — TISSUE TRANSGLUTAMINASE, IGA: Tissue Transglutaminase Ab, IgA: 8.4 U/mL (ref <20)

## 2013-02-10 LAB — FERRITIN: Ferritin: 43.4 ng/mL (ref 10.0–291.0)

## 2013-02-11 LAB — EPSTEIN BARR VRS(EBV DNA BY PCR): EBV DNA QN by PCR: <500 copies/mL (ref <500)

## 2013-02-14 LAB — ALPHA-1-ANTITRYPSIN: A-1 Antitrypsin, Ser: 240 mg/dL — ABNORMAL HIGH (ref 90–200)

## 2013-02-16 ENCOUNTER — Other Ambulatory Visit: Payer: Self-pay | Admitting: *Deleted

## 2013-02-16 DIAGNOSIS — R945 Abnormal results of liver function studies: Secondary | ICD-10-CM

## 2013-02-28 ENCOUNTER — Ambulatory Visit: Payer: 59

## 2013-03-01 ENCOUNTER — Ambulatory Visit
Admission: RE | Admit: 2013-03-01 | Discharge: 2013-03-01 | Disposition: A | Discharge status: Home / Self Care | Payer: 59 | Source: Ambulatory Visit

## 2013-03-01 DIAGNOSIS — Z1231 Encounter for screening mammogram for malignant neoplasm of breast: Secondary | ICD-10-CM

## 2013-03-15 ENCOUNTER — Other Ambulatory Visit (INDEPENDENT_AMBULATORY_CARE_PROVIDER_SITE_OTHER): Payer: 59

## 2013-03-15 DIAGNOSIS — R7989 Other specified abnormal findings of blood chemistry: Secondary | ICD-10-CM

## 2013-03-15 DIAGNOSIS — R945 Abnormal results of liver function studies: Secondary | ICD-10-CM

## 2013-03-15 LAB — HEPATIC FUNCTION PANEL
AST: 23 U/L (ref 0–37)
Albumin: 3.8 g/dL (ref 3.5–5.2)
Alkaline Phosphatase: 98 U/L (ref 39–117)
Bilirubin, Direct: 0.1 mg/dL (ref 0.0–0.3)
Total Bilirubin: 0.4 mg/dL (ref 0.3–1.2)

## 2013-03-17 ENCOUNTER — Encounter: Payer: Self-pay | Admitting: Gastroenterology

## 2013-03-17 ENCOUNTER — Other Ambulatory Visit: Payer: Self-pay

## 2013-03-17 ENCOUNTER — Ambulatory Visit (INDEPENDENT_AMBULATORY_CARE_PROVIDER_SITE_OTHER): Payer: 59 | Admitting: Gastroenterology

## 2013-03-17 VITALS — BP 126/86 | HR 68 | Ht 68.25 in | Wt 222.0 lb

## 2013-03-17 DIAGNOSIS — R1013 Epigastric pain: Secondary | ICD-10-CM

## 2013-03-17 DIAGNOSIS — R7401 Elevation of levels of liver transaminase levels: Secondary | ICD-10-CM

## 2013-03-17 DIAGNOSIS — Z23 Encounter for immunization: Secondary | ICD-10-CM

## 2013-03-17 DIAGNOSIS — R110 Nausea: Secondary | ICD-10-CM

## 2013-03-17 NOTE — Progress Notes (Signed)
    History of Present Illness: This is a 47 year old female accompanied by her mother. She relates episodes of epigastric pain associated with nausea, vomiting, diarrhea and fevers that began several months ago. She states her symptoms are reminiscent of her prior gallbladder symptoms. She underwent cholecystectomy in April 2013 and the intraoperative cholangiogram was negative. A recent abdominal ultrasound did not show biliary dilatation however mild hepatomegaly and the right lobe hemangioma were noted. MRI in April 2013 showed a 2.6 cm hemangioma in the anterior segment of right hepatic lobe. Elevated liver function tests were noted and a half steadily improved over the course of time and they are now normal. Every 1-2 weeks she has an episode of epigastric pain associated with nausea. She no longer has fevers vomiting or diarrhea. Extensive hepatic serologies were unremarkable. Iron deficiency was noted.  Current Medications, Allergies, Past Medical History, Past Surgical History, Family History and Social History were reviewed in [employer] record.  Physical Exam: General: Well developed , well nourished, no acute distress Head: Normocephalic and atraumatic Eyes:  sclerae anicteric, EOMI Ears: Normal auditory acuity Mouth: No deformity or lesions Lungs: Clear throughout to auscultation Heart: Regular rate and rhythm; no murmurs, rubs or bruits Abdomen: Soft, non tender and non distended. No masses, hepatosplenomegaly or hernias noted. Normal Bowel sounds Musculoskeletal: Symmetrical with no gross deformities  Pulses:  Normal pulses noted Extremities: No clubbing, cyanosis, edema or deformities noted Neurological: Alert oriented x 4, grossly nonfocal Psychological:  Alert and cooperative. Normal mood and affect  Assessment and Recommendations:  1. Elevated LFTs, all improving. Episodic epigastric pain and nausea. Etiology not determined. Possible unidentified  viral hepatitis or toxic hepatitis that is now resolving. Possible choledocholithiasis or microlithiasis however the pattern of her LFT elevations do not correlate well with her intermittent symptoms. Schedule MRCP for further evaluation. I advised her to come in for a CBC, liver function tests and lipase 1-2 days after recurrent episodes of pain.  2. Iron deficiency anemia. Further evaluation after problem #1 has been addressed.  3. 2.6 cm right hepatic lobe hemangioma.  4. Prior cholecystectomy.

## 2013-03-17 NOTE — Patient Instructions (Signed)
You have been scheduled for an MRI/ MRCP at [facility] on 03/25/13 . Your appointment time is 3:00pm. Please arrive 15 minutes prior to your appointment time for registration purposes. Nothing by mouth 4 hours prior to your test . However, if you have any metal in your body, have a pacemaker or defibrillator, please be sure to let your ordering physician know. This test typically takes 45 minutes to 1 hour to complete.  You have been given your flu shot today.   Call back if you have more episodes of pain so we can have you come in for repeat liver function tests.  Thank you for choosing me and [facility].  Venita Lick. Pleas Koch., MD., Clementeen Graham

## 2013-03-25 ENCOUNTER — Other Ambulatory Visit: Payer: Self-pay | Admitting: Gastroenterology

## 2013-03-25 ENCOUNTER — Ambulatory Visit (HOSPITAL_COMMUNITY)
Admission: RE | Admit: 2013-03-25 | Discharge: 2013-03-25 | Disposition: A | Discharge status: Home / Self Care | Payer: 59 | Source: Ambulatory Visit | Attending: Gastroenterology | Admitting: Gastroenterology

## 2013-03-25 DIAGNOSIS — R7401 Elevation of levels of liver transaminase levels: Secondary | ICD-10-CM

## 2013-03-25 DIAGNOSIS — Z9089 Acquired absence of other organs: Secondary | ICD-10-CM | POA: Insufficient documentation

## 2013-03-25 DIAGNOSIS — R1013 Epigastric pain: Secondary | ICD-10-CM

## 2013-03-25 DIAGNOSIS — R7989 Other specified abnormal findings of blood chemistry: Secondary | ICD-10-CM | POA: Insufficient documentation

## 2013-03-25 DIAGNOSIS — D1803 Hemangioma of intra-abdominal structures: Secondary | ICD-10-CM | POA: Insufficient documentation

## 2013-03-25 MED ORDER — GADOBENATE DIMEGLUMINE 529 MG/ML IV SOLN
20.0000 mL | Freq: Once | INTRAVENOUS | Status: AC | PRN
  Administered 2013-03-25: 20 mL via INTRAVENOUS

## 2013-03-28 ENCOUNTER — Encounter: Payer: Self-pay | Admitting: Gastroenterology

## 2013-03-28 LAB — POCT I-STAT, CHEM 8
BUN: 9 mg/dL (ref 6–23)
Chloride: 98 mEq/L (ref 96–112)
Creatinine, Ser: 1 mg/dL (ref 0.50–1.10)
Glucose, Bld: 95 mg/dL (ref 70–99)
Sodium: 139 mEq/L (ref 135–145)
TCO2: 28 mmol/L (ref 0–100)

## 2013-05-02 ENCOUNTER — Telehealth: Payer: Self-pay | Admitting: Family Medicine

## 2013-05-02 NOTE — Telephone Encounter (Signed)
Patient Information:  Caller Name: Enna  Phone: [phone]  Patient: April [name]  Gender: Female  DOB: October 22, 1965  Age: 47 Years  PCP: Kelle Darting [facility])  Pregnant: No  Office Follow Up:  Does the office need to follow up with this patient?: No  Instructions For The Office: N/A   Symptoms  Reason For Call & Symptoms: Has non productive cough since 12-22. Has had cold symptoms since 12-18.  Reviewed Health History In EMR: Yes  Reviewed Medications In EMR: Yes  Reviewed Allergies In EMR: Yes  Reviewed Surgeries / Procedures: Yes  Date of Onset of Symptoms: 05/02/2013  Treatments Tried: ITC Sudafed Nyquil  Treatments Tried Worked: No OB / GYN:  LMP: 04/18/2013  Guideline(s) Used:  Colds  Disposition Per Guideline:   Home Care  Reason For Disposition Reached:   Colds with no complications  Advice Given:  Treatment for Associated Symptoms of Colds:  For muscle aches, headaches, or moderate fever (more than 101 F or 38.9 C): Take acetaminophen every 4 hours.  Hydrate: Drink adequate liquids.  Humidifier:  If the air in your home is dry, use a cool-mist humidifier  Expected Course:   Nasal discharge 7-14 days  Cough up to 2-3 weeks.  Pain and Fever Medicines:  For pain or fever relief, take either acetaminophen or ibuprofen.  For a Stuffy Nose - Use Nasal Washes:  Introduction: Saline (salt water) nasal irrigation (nasal wash) is an effective and simple home remedy for treating stuffy nose and sinus congestion. The nose can be irrigated by pouring, spraying, or squirting salt water into the nose and then letting it run back out.  Patient Will Follow Care Advice:  YES

## 2013-05-09 ENCOUNTER — Encounter: Payer: Self-pay | Admitting: Gynecology

## 2013-05-09 ENCOUNTER — Ambulatory Visit (INDEPENDENT_AMBULATORY_CARE_PROVIDER_SITE_OTHER): Payer: 59 | Admitting: Gynecology

## 2013-05-09 VITALS — BP 118/70 | Ht 68.25 in | Wt 224.0 lb

## 2013-05-09 DIAGNOSIS — Z01419 Encounter for gynecological examination (general) (routine) without abnormal findings: Secondary | ICD-10-CM

## 2013-05-09 DIAGNOSIS — N926 Irregular menstruation, unspecified: Secondary | ICD-10-CM

## 2013-05-09 DIAGNOSIS — IMO0002 Reserved for concepts with insufficient information to code with codable children: Secondary | ICD-10-CM

## 2013-05-09 DIAGNOSIS — R21 Rash and other nonspecific skin eruption: Secondary | ICD-10-CM

## 2013-05-09 LAB — TSH: TSH: 1.702 u[IU]/mL (ref 0.350–4.500)

## 2013-05-09 MED ORDER — ESTRADIOL 0.1 MG/GM VA CREA: 1.0000 | TOPICAL_CREAM | VAGINAL | Status: DC

## 2013-05-09 MED ORDER — NORETHIN-ETH ESTRAD-FE BIPHAS 1 MG-10 MCG / 10 MCG PO TABS: ORAL_TABLET | ORAL | Status: DC

## 2013-05-09 NOTE — Progress Notes (Signed)
April [name] March 15, 1966 [id number]        47 y.o.  G0P0 for annual exam.  Several issues noted below.  Past medical history,surgical history, problem list, medications, allergies, family history and social history were all reviewed and documented in the EPIC chart.  ROS:  Performed and pertinent positives and negatives are included in the history, assessment and plan .  Exam: Sherrilyn Rist assistant Filed Vitals:   05/09/13 1613  BP: 118/70  Height: 5' 8.25" (1.734 m)  Weight: 224 lb (101.606 kg)   General appearance  Normal Skin grossly normal Head/Neck normal with no cervical or supraclavicular adenopathy thyroid normal Lungs  clear Cardiac RR, without RMG Abdominal  soft, nontender, without masses, organomegaly or hernia Breasts  examined lying and sitting without masses, retractions, discharge or axillary adenopathy. Mild papular erythematous skin rash under both breasts consistent with fungal. Pelvic  Ext/BUS/vagina  Normal without evidence of discharge.  Cervix  Normal   Uterus  anteverted, normal size, shape and contour, midline and mobile nontender   Adnexa  Without masses or tenderness    Anus and perineum  Normal   Rectovaginal  Normal sphincter tone without palpated masses or tenderness.    Assessment/Plan:  47 y.o. G0P0 female for annual exam.   1. Irregular menses/dyspareunia. Patient started on LoLoEstrin 1/10 equivalent several months ago. Has had light menses but then started bleeding 2 weeks into her pack this last time and did not have a withdrawal bleed when she should at the end of the pack. She restarted a new pack. Is not having symptoms such as hot flushes, night sweats. Is having irritative dyspareunia.  It feels irritated/sharp pain from initial insertion through all. Does feel like she is lubricating but having a lot of vaginal pain. No deep dyspareunia as if something is being hit. Exam today is normal without evidence of infectious vaginitis. Various options  reviewed and ultimately recommend trial of vaginal estrogen supplement with Estrace vaginal cream 3 times weekly initially and then twice weekly. Keep her LoLoEstrin 1/10 at the same dose. If she would continue to have irregular menses then she will call and possibly consider increasing her estrogen to a 1/20 pill. I again reviewed the risks of birth control pills noting her smoking history 15 years ago and her well-controlled hypertension of stroke heart attack DVT. She understands and accepts these risks at this point wants to continue. Will check baseline labs to include hCG TSH prolactin FSH. I am also covering her for yeast with Diflucan 150 mg daily x5 days in event this is contributing to her irritated dyspareunia as well as for her skin rash under her breasts. 2. Skin rash under breasts consistent with fungal. Mytrex cream nightly as needed. Diflucan 150 mg daily x5 days. Followup if symptoms persist or recur. 3. Pap smear 2012. No Pap smear done today. No history of significant abnormal Pap smears previously. Plan repeat Pap smear next year 3 year interval. 4. Mammography 02/2013. Continued annual mammography. SBE monthly reviewed. 5. Health maintenance. No routine blood work done as this is reportedly done through her primary physician's office. Patient will call me with followup results as far as he dyspareunia in one to 2 months.  Note: This document was prepared with digital dictation and possible smart phrase technology. Any transcriptional errors that result from this process are unintentional.   Dara Lords MD, 4:52 PM 05/09/2013

## 2013-05-09 NOTE — Patient Instructions (Signed)
Use antifungal cream under both breasts at nighttime as needed for rash. Take Diflucan pill daily for 5 days. Used vaginal estrogen cream 3 times weekly for several weeks then twice weekly. Call me in followup in one to 2 months as far as menstrual regularity and irritation with intercourse.

## 2013-05-10 LAB — URINALYSIS W MICROSCOPIC + REFLEX CULTURE
Casts: NONE SEEN
Hgb urine dipstick: NEGATIVE
Leukocytes, UA: NEGATIVE
Nitrite: NEGATIVE
Protein, ur: NEGATIVE mg/dL

## 2013-05-10 LAB — HCG, SERUM, QUALITATIVE: Preg, Serum: NEGATIVE

## 2013-05-10 LAB — PROLACTIN: Prolactin: 13 ng/mL

## 2013-05-10 LAB — FOLLICLE STIMULATING HORMONE: FSH: 5.1 m[IU]/mL

## 2013-05-18 ENCOUNTER — Other Ambulatory Visit: Payer: Self-pay | Admitting: Family Medicine

## 2013-07-29 ENCOUNTER — Encounter: Payer: Self-pay | Admitting: Gynecology

## 2013-07-29 ENCOUNTER — Ambulatory Visit (INDEPENDENT_AMBULATORY_CARE_PROVIDER_SITE_OTHER): Payer: 59 | Admitting: Gynecology

## 2013-07-29 DIAGNOSIS — N899 Noninflammatory disorder of vagina, unspecified: Secondary | ICD-10-CM

## 2013-07-29 DIAGNOSIS — N898 Other specified noninflammatory disorders of vagina: Secondary | ICD-10-CM

## 2013-07-29 LAB — WET PREP FOR TRICH, YEAST, CLUE
CLUE CELLS WET PREP: NONE SEEN
Trich, Wet Prep: NONE SEEN
WBC, Wet Prep HPF POC: NONE SEEN
Yeast Wet Prep HPF POC: NONE SEEN

## 2013-07-29 MED ORDER — METRONIDAZOLE 500 MG PO TABS: 500.0000 mg | ORAL_TABLET | Freq: Two times a day (BID) | ORAL | Status: DC

## 2013-07-29 MED ORDER — TERCONAZOLE 0.8 % VA CREA: 1.0000 | TOPICAL_CREAM | Freq: Every day | VAGINAL | Status: DC

## 2013-07-29 NOTE — Progress Notes (Addendum)
April [name] Nov 05, 1965 [id number]        48 y.o.  G0P0 took one Diflucan she had at home. Also used a boric acid suppository last night. Seems to be getting worse. Is in a new relationship. No other symptoms such as abdominal pain or urinary symptoms.  Past medical history,surgical history, problem list, medications, allergies, family history and social history were all reviewed and documented in the EPIC chart.  Exam: Kim assistant General appearance  Normal Abdomen soft nontender without masses guarding rebound organomegaly. Pelvic external with intense erythema involving inner aspects of both labia minora and periclitoral hood. No specific lesions or ulcerations. Vagina also erythematous with scant discharge. Cervix normal. Uterus normal size midline mobile nontender. Adnexa without masses or tenderness.  Assessment/Plan:  48 y.o. G0P0 intense vulvovaginitis. No ulcerations or other changes to suggest HSV. I did review the possibility but unlikely. Patient will followup if she would develop ulcerations or does not get better with the treatment. The wet prep is negative which I suspect is because of the Diflucan/boric acid. We'll cover for yeast with Terazol 3 day cream and bacterial with Flagyl 500 mg twice a day x7 days. Alcohol avoidance reviewed. Gc/chlamydia screen done. Followup if symptoms persist, worsen or recur.   Note: This document was prepared with digital dictation and possible smart phrase technology. Any transcriptional errors that result from this process are unintentional.   [name] MD, 11:03 AM 07/29/2013

## 2013-07-29 NOTE — Patient Instructions (Signed)
Take Flagyl pill twice daily for 7 days. Avoid alcohol while taking. Used Terazol vaginal cream nightly for 3 nights. Followup if symptoms persist, worsen or recur.

## 2013-07-30 LAB — GC/CHLAMYDIA PROBE AMP
CT Probe RNA: NEGATIVE
GC PROBE AMP APTIMA: NEGATIVE

## 2013-10-19 ENCOUNTER — Ambulatory Visit (INDEPENDENT_AMBULATORY_CARE_PROVIDER_SITE_OTHER): Payer: 59 | Admitting: Women's Health

## 2013-10-19 ENCOUNTER — Encounter: Payer: Self-pay | Admitting: Women's Health

## 2013-10-19 DIAGNOSIS — B3731 Acute candidiasis of vulva and vagina: Secondary | ICD-10-CM

## 2013-10-19 DIAGNOSIS — B373 Candidiasis of vulva and vagina: Secondary | ICD-10-CM

## 2013-10-19 LAB — WET PREP FOR TRICH, YEAST, CLUE
Clue Cells Wet Prep HPF POC: NONE SEEN
Trich, Wet Prep: NONE SEEN

## 2013-10-19 MED ORDER — FLUCONAZOLE 150 MG PO TABS: 150.0000 mg | ORAL_TABLET | Freq: Once | ORAL | Status: DC

## 2013-10-19 MED ORDER — FLUCONAZOLE 150 MG PO TABS: ORAL_TABLET | ORAL | Status: DC

## 2013-10-19 NOTE — Patient Instructions (Signed)
Monilial Vaginitis  Vaginitis in a soreness, swelling and redness (inflammation) of the vagina and vulva. Monilial vaginitis is not a sexually transmitted infection.  CAUSES   Yeast vaginitis is caused by yeast (candida) that is normally found in your vagina. With a yeast infection, the candida has overgrown in number to a point that upsets the chemical balance.  SYMPTOMS   · White, thick vaginal discharge.  · Swelling, itching, redness and irritation of the vagina and possibly the lips of the vagina (vulva).  · Burning or painful urination.  · Painful intercourse.  DIAGNOSIS   Things that may contribute to monilial vaginitis are:  · Postmenopausal and virginal states.  · Pregnancy.  · Infections.  · Being tired, sick or stressed, especially if you had monilial vaginitis in the past.  · Diabetes. Good control will help lower the chance.  · Birth control pills.  · Tight fitting garments.  · Using bubble bath, feminine sprays, douches or deodorant tampons.  · Taking certain medications that kill germs (antibiotics).  · Sporadic recurrence can occur if you become ill.  TREATMENT   Your caregiver will give you medication.  · There are several kinds of anti monilial vaginal creams and suppositories specific for monilial vaginitis. For recurrent yeast infections, use a suppository or cream in the vagina 2 times a week, or as directed.  · Anti-monilial or steroid cream for the itching or irritation of the vulva may also be used. Get your caregiver's permission.  · Painting the vagina with methylene blue solution may help if the monilial cream does not work.  · Eating yogurt may help prevent monilial vaginitis.  HOME CARE INSTRUCTIONS   · Finish all medication as prescribed.  · Do not have sex until treatment is completed or after your caregiver tells you it is okay.  · Take warm sitz baths.  · Do not douche.  · Do not use tampons, especially scented ones.  · Wear cotton underwear.  · Avoid tight pants and panty  hose.  · Tell your sexual partner that you have a yeast infection. They should go to their caregiver if they have symptoms such as mild rash or itching.  · Your sexual partner should be treated as well if your infection is difficult to eliminate.  · Practice safer sex. Use condoms.  · Some vaginal medications cause latex condoms to fail. Vaginal medications that harm condoms are:  · Cleocin cream.  · Butoconazole (Femstat®).  · Terconazole (Terazol®) vaginal suppository.  · Miconazole (Monistat®) (may be purchased over the counter).  SEEK MEDICAL CARE IF:   · You have a temperature by mouth above 102° F (38.9° C).  · The infection is getting worse after 2 days of treatment.  · The infection is not getting better after 3 days of treatment.  · You develop blisters in or around your vagina.  · You develop vaginal bleeding, and it is not your menstrual period.  · You have pain when you urinate.  · You develop intestinal problems.  · You have pain with sexual intercourse.  Document Released: 02/05/2005 Document Revised: 07/21/2011 Document Reviewed: 10/20/2008  [employer]® Patient Information ©2014 [employer].

## 2013-10-19 NOTE — Progress Notes (Signed)
Patient ID: April [name], female   DOB: 1965-11-29, 47 y.o.   MRN: [id number]  Presents with complaint of vaginal pruritus and burning x 2 days. Regular cycle/Lo Loestrin. Denies any spotting or urinary symptoms. Same partner. OTC Monistat with minimal relief. History of recurrent yeast infections (last 07/2103) has used boric acid with significant relief in the past but stopped. Recent 52 pound wt loss.  Exam: Appears well External: Labia erythematous, numerous sebaceous cysts along medial thighs- few are inflamed. Speculum: Introitus erythematous, thick white discharge along vaginal wall. Wet Prep: Few yeast, few bacteria Bimanual no CMT or adnexal fullness or tenderness.  Yeast Vaginitis  Plan: Diflucan 150mg  po today, repeat in 3 days. Begin boric acid gel caps intravaginally twice weekly once symptoms resolve. Yeast prevention strategies reviewed.  Instructed to call if symptoms do not improve

## 2013-12-09 IMAGING — CR DG ABDOMEN ACUTE W/ 1V CHEST
3 series · 3 of 3 positions shown · non-contrast
Comparison: None

CLINICAL DATA: Mid abdominal pain, nausea, vomiting, diarrhea,
history hypertension

ACUTE ABDOMEN SERIES (ABDOMEN 2 VIEW & CHEST 1 VIEW)

[w chest pa]
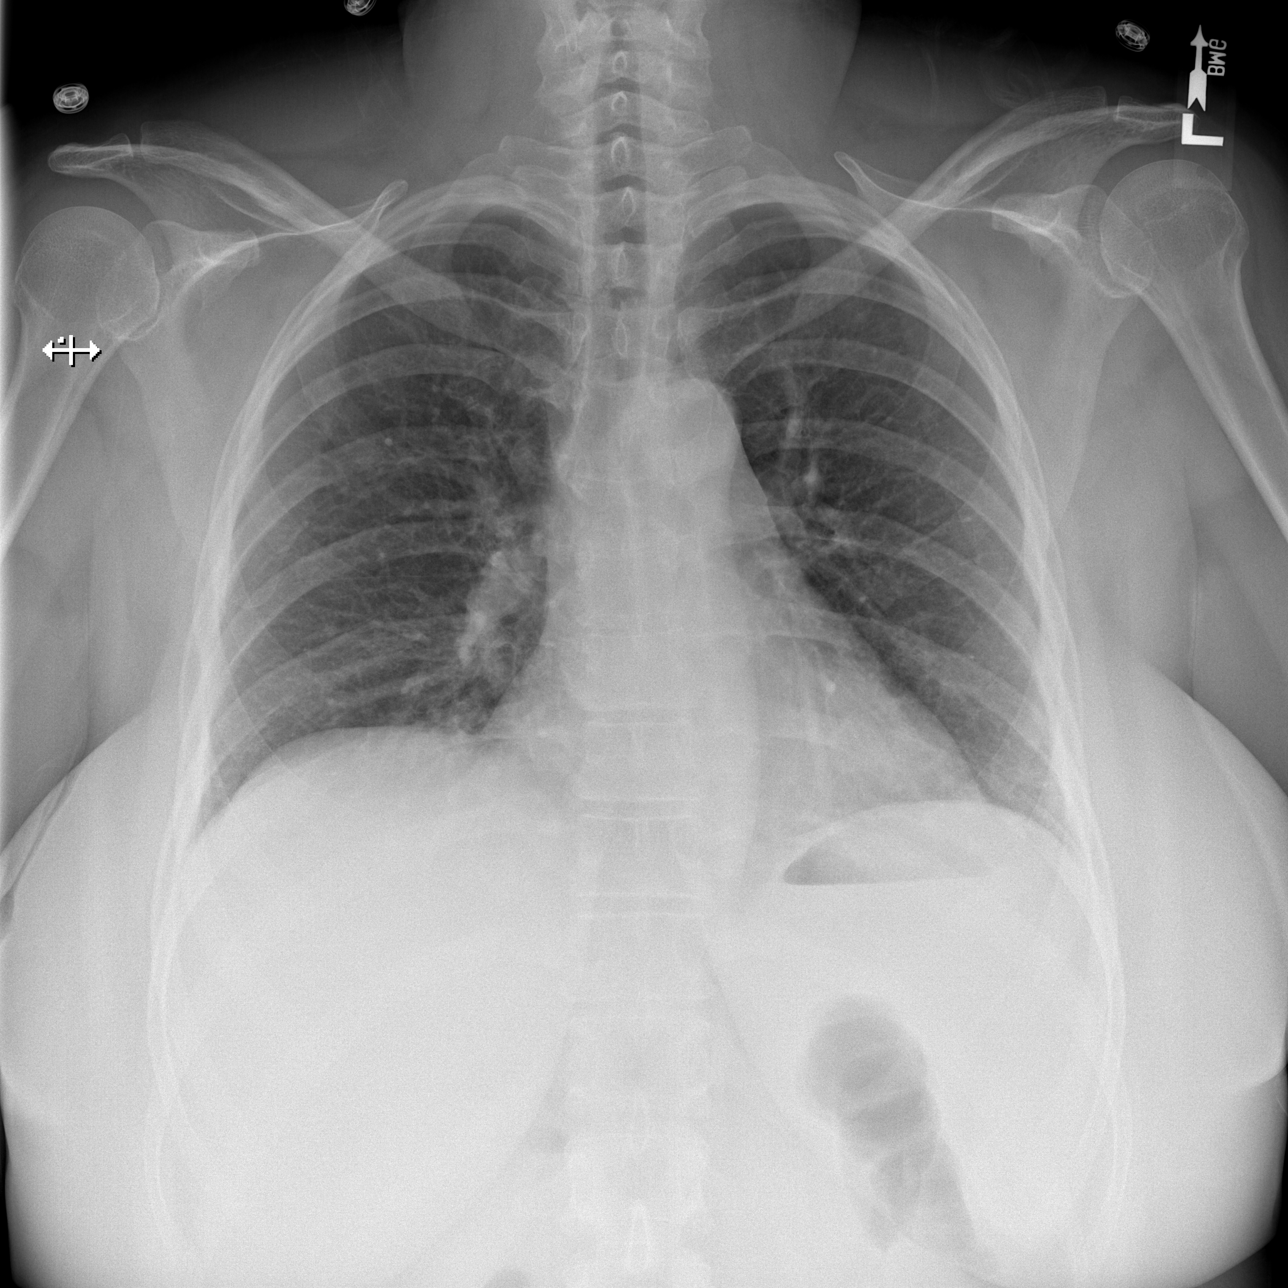

[w abdomen upright]
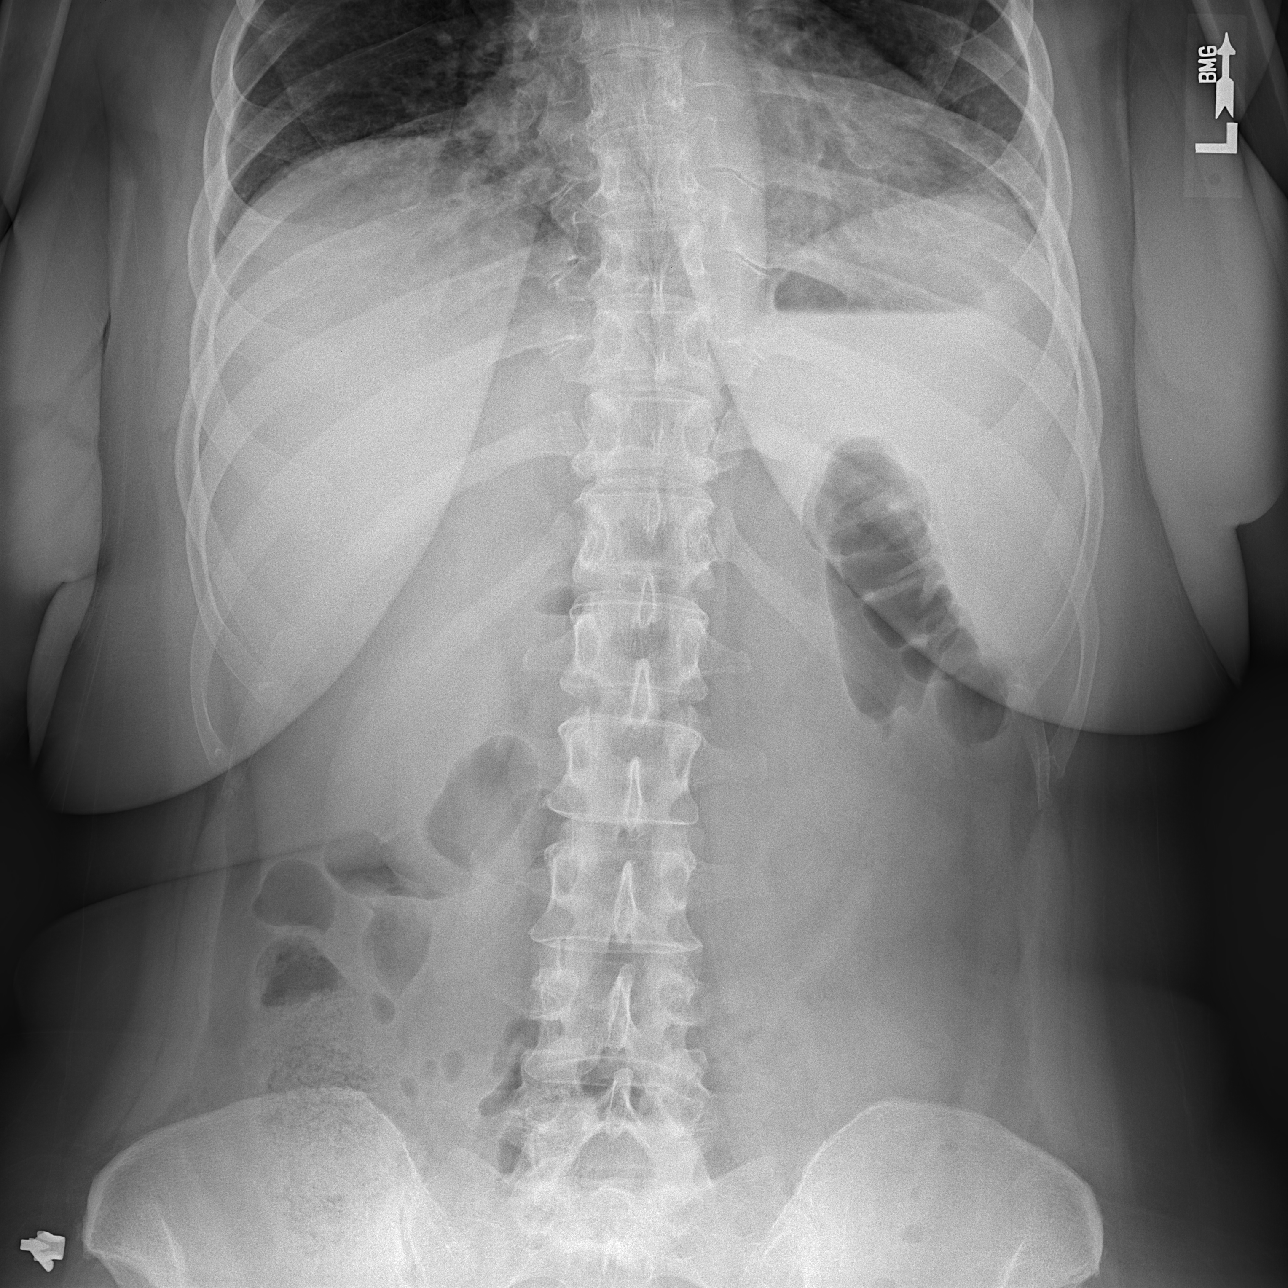

[t abdomen supine]
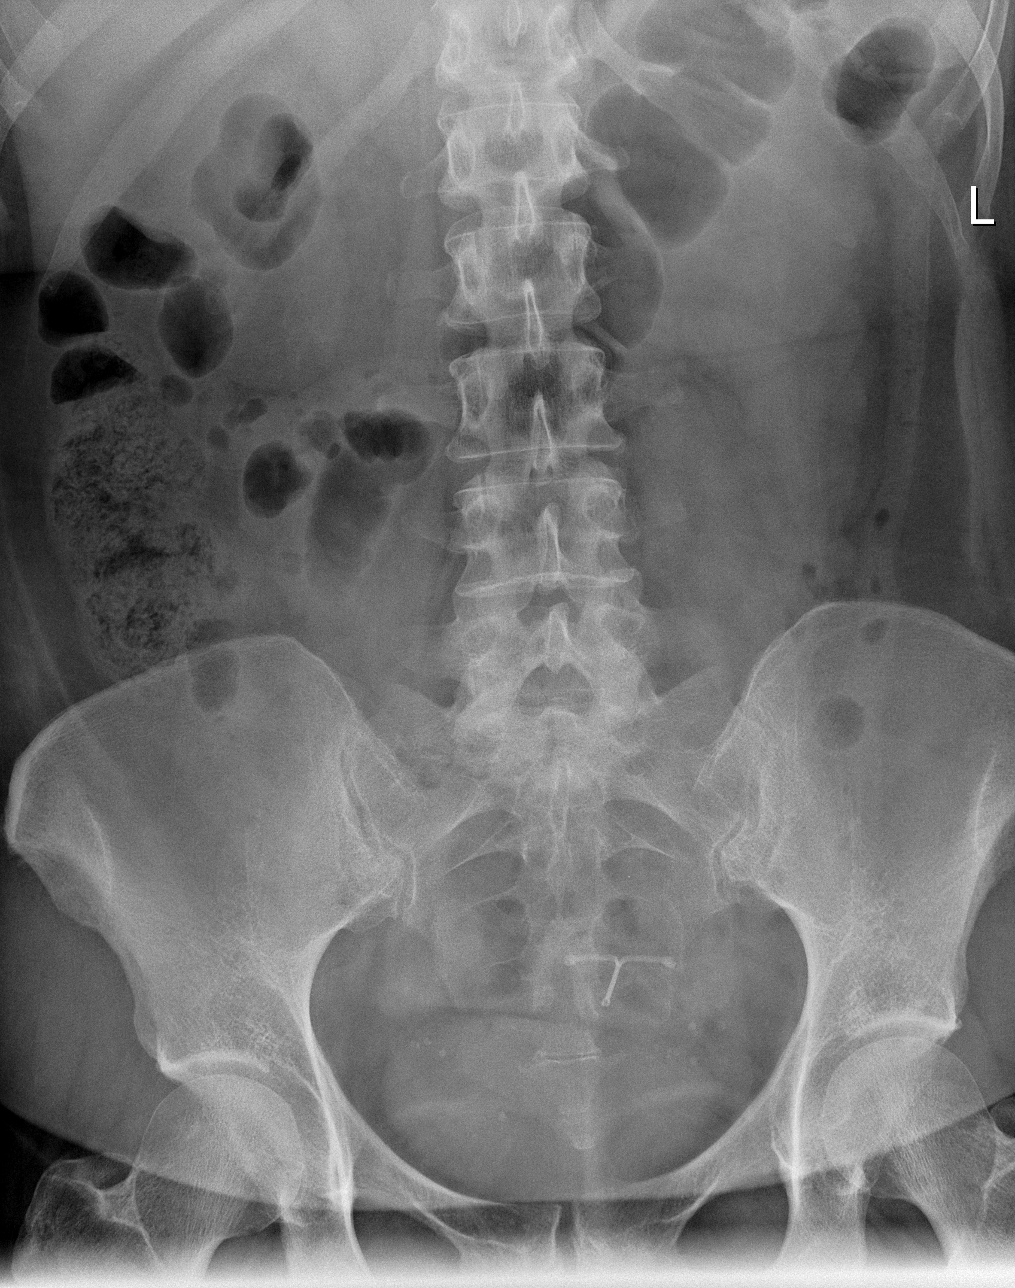

[3 of 3 positions shown; findings below may reference images not displayed]

FINDINGS: Minimal enlargement of cardiac silhouette.
Mediastinal contours and pulmonary vascularity normal.
Lungs clear.
IUD projects over left sacrum.
Scattered pelvic phleboliths.
Nonobstructive bowel gas pattern.
No bowel dilatation, bowel wall thickening, or free intraperitoneal
air.
Bones unremarkable.
No definite urinary tract calcifications.
IMPRESSION: Nonobstructive bowel gas pattern.
Minimal enlargement of cardiac silhouette.

## 2014-02-07 ENCOUNTER — Other Ambulatory Visit: Payer: Self-pay

## 2014-02-07 DIAGNOSIS — B373 Candidiasis of vulva and vagina: Secondary | ICD-10-CM

## 2014-02-07 DIAGNOSIS — B3731 Acute candidiasis of vulva and vagina: Secondary | ICD-10-CM

## 2014-02-07 MED ORDER — FLUCONAZOLE 150 MG PO TABS: ORAL_TABLET | ORAL | Status: DC

## 2014-02-08 ENCOUNTER — Other Ambulatory Visit: Payer: Self-pay

## 2014-02-08 DIAGNOSIS — Z1231 Encounter for screening mammogram for malignant neoplasm of breast: Secondary | ICD-10-CM

## 2014-03-08 ENCOUNTER — Ambulatory Visit
Admission: RE | Admit: 2014-03-08 | Discharge: 2014-03-08 | Disposition: A | Discharge status: Home / Self Care | Payer: 59 | Source: Ambulatory Visit

## 2014-03-08 DIAGNOSIS — Z1231 Encounter for screening mammogram for malignant neoplasm of breast: Secondary | ICD-10-CM

## 2014-03-28 ENCOUNTER — Telehealth: Payer: Self-pay

## 2014-03-28 NOTE — Telephone Encounter (Signed)
Patient contacted to schedule follow up MRI recommended on MRCP from 03/25/13.  She declines to schedule at this time.  "affordability is an issue at this time".  She wants to wait until next year.  She states she will mark her calendar and call me back when she is ready to schedule next year

## 2014-03-28 NOTE — Telephone Encounter (Signed)
-----   Message from Kellie Moor, RN sent at 03/28/2013  2:05 PM EST ----- Needs MRI for abnormal MRI 03/2013.

## 2014-04-10 ENCOUNTER — Other Ambulatory Visit: Payer: Self-pay

## 2014-04-10 DIAGNOSIS — B3731 Acute candidiasis of vulva and vagina: Secondary | ICD-10-CM

## 2014-04-10 DIAGNOSIS — B373 Candidiasis of vulva and vagina: Secondary | ICD-10-CM

## 2014-04-10 MED ORDER — FLUCONAZOLE 150 MG PO TABS: ORAL_TABLET | ORAL | Status: DC

## 2014-04-10 NOTE — Telephone Encounter (Signed)
Ok to refill, OV if no relief

## 2014-04-11 ENCOUNTER — Other Ambulatory Visit: Payer: Self-pay | Admitting: Gynecology

## 2014-04-13 ENCOUNTER — Other Ambulatory Visit: Payer: Self-pay | Admitting: Gynecology

## 2014-04-14 ENCOUNTER — Ambulatory Visit (INDEPENDENT_AMBULATORY_CARE_PROVIDER_SITE_OTHER): Payer: 59 | Admitting: Gynecology

## 2014-04-14 ENCOUNTER — Encounter: Payer: Self-pay | Admitting: Gynecology

## 2014-04-14 DIAGNOSIS — B3731 Acute candidiasis of vulva and vagina: Secondary | ICD-10-CM

## 2014-04-14 DIAGNOSIS — B373 Candidiasis of vulva and vagina: Secondary | ICD-10-CM

## 2014-04-14 DIAGNOSIS — L298 Other pruritus: Secondary | ICD-10-CM

## 2014-04-14 DIAGNOSIS — N898 Other specified noninflammatory disorders of vagina: Secondary | ICD-10-CM

## 2014-04-14 LAB — WET PREP FOR TRICH, YEAST, CLUE
Clue Cells Wet Prep HPF POC: NONE SEEN
Trich, Wet Prep: NONE SEEN

## 2014-04-14 MED ORDER — FLUCONAZOLE 200 MG PO TABS: 200.0000 mg | ORAL_TABLET | Freq: Every day | ORAL | Status: DC

## 2014-04-14 NOTE — Patient Instructions (Signed)
Take the Diflucan pill. Follow up if your symptoms persist, worsen or recur.  Follow up for your annual exam 30 December as scheduled.

## 2014-04-14 NOTE — Progress Notes (Signed)
April [name] 1965-05-22 [id number]        48 y.o.  G0P0 presents having developed itching with vaginal discharge this past week. Took 1 Diflucan that she had at home. Symptoms seem a little better but still persistent. No odor or urinary symptoms such as frequency dysuria or urgency. No low back pain.  Past medical history,surgical history, problem list, medications, allergies, family history and social history were all reviewed and documented in the EPIC chart.  Directed ROS with pertinent positives and negatives documented in the history of present illness/assessment and plan.  Exam: Kim assistant General appearance:  Normal Abdomen soft nontender without masses guarding rebound Pelvic external BUS vagina with white discharge. Cervix normal. Uterus normal size midline mobile nontender. Adnexa without masses or tenderness.  Assessment/Plan:  48 y.o. G0P0 with above history and wet prep showing yeast. Reviewed options with her to include Terazol vaginal cream versus a repeat dose of Diflucan as she is feeling a little better but not quite clear. Patient would prefer the Diflucan and I prescribed Diflucan 200 mg 1 dose. Follow up if symptoms persist, worsen or recur.  Patient had questions about her history of menorrhagia.  History of hysteroscopic resection of endometrial polyps 08/2012. Initially had IUD placed previously but this was removed. She subsequently resumed heavy menses and then was placed on low-dose oral contraceptives which seems to control her bleeding. She had questions about endometrial ablation as she would prefer not to continue the birth control pills. Her partner has had a vasectomy. I reviewed in general what's involved with [facility].  She understands is a permanent procedure she should never pursue pregnancy following this. She also understands that most patients continue to have menses but 90% report satisfactory results as far as lightning their menses. There are failures to  this. She is to read of the information visit the website and follow up if she is interested in pursuing this. She is due for her annual exam the end of December and will follow up for this at that time.     [name] MD, 2:33 PM 04/14/2014

## 2014-05-10 ENCOUNTER — Ambulatory Visit (INDEPENDENT_AMBULATORY_CARE_PROVIDER_SITE_OTHER): Payer: 59 | Admitting: Gynecology

## 2014-05-10 ENCOUNTER — Other Ambulatory Visit (HOSPITAL_COMMUNITY)
Admission: RE | Admit: 2014-05-10 | Discharge: 2014-05-10 | Disposition: A | Discharge status: Home / Self Care | Payer: 59 | Source: Ambulatory Visit | Attending: Gynecology | Admitting: Gynecology

## 2014-05-10 ENCOUNTER — Encounter: Payer: Self-pay | Admitting: Gynecology

## 2014-05-10 VITALS — BP 130/84 | Ht 70.0 in | Wt 184.0 lb

## 2014-05-10 DIAGNOSIS — R87810 Cervical high risk human papillomavirus (HPV) DNA test positive: Secondary | ICD-10-CM | POA: Diagnosis present

## 2014-05-10 DIAGNOSIS — N760 Acute vaginitis: Secondary | ICD-10-CM

## 2014-05-10 DIAGNOSIS — Z01411 Encounter for gynecological examination (general) (routine) with abnormal findings: Secondary | ICD-10-CM | POA: Insufficient documentation

## 2014-05-10 DIAGNOSIS — Z1151 Encounter for screening for human papillomavirus (HPV): Secondary | ICD-10-CM | POA: Diagnosis present

## 2014-05-10 DIAGNOSIS — Z01419 Encounter for gynecological examination (general) (routine) without abnormal findings: Secondary | ICD-10-CM

## 2014-05-10 MED ORDER — FLUCONAZOLE 150 MG PO TABS: 150.0000 mg | ORAL_TABLET | ORAL | Status: DC

## 2014-05-10 MED ORDER — NORETHIN-ETH ESTRAD-FE BIPHAS 1 MG-10 MCG / 10 MCG PO TABS: ORAL_TABLET | ORAL | Status: DC

## 2014-05-10 NOTE — Addendum Note (Signed)
Addended by: Federico Flake on: 05/10/2014 11:08 AM   Modules accepted: Orders

## 2014-05-10 NOTE — Patient Instructions (Signed)
Call if you want to choose an alternative such as IUD or endometrial ablation. Otherwise continue on the low-dose birth control pills as we discussed.  Take the Diflucan pill weekly for 3 months. Call me if you have recurrent vaginal infections despite this.  You may obtain a copy of any labs that were done today by logging onto MyChart as outlined in the instructions provided with your AVS (after visit summary). The office will not call with normal lab results but certainly if there are any significant abnormalities then we will contact you.   Health Maintenance, Female A healthy lifestyle and preventative care can promote health and wellness.  Maintain regular health, dental, and eye exams.  Eat a healthy diet. Foods like vegetables, fruits, whole grains, low-fat dairy products, and lean protein foods contain the nutrients you need without too many calories. Decrease your intake of foods high in solid fats, added sugars, and salt. Get information about a proper diet from your caregiver, if necessary.  Regular physical exercise is one of the most important things you can do for your health. Most adults should get at least 150 minutes of moderate-intensity exercise (any activity that increases your heart rate and causes you to sweat) each week. In addition, most adults need muscle-strengthening exercises on 2 or more days a week.   Maintain a healthy weight. The body mass index (BMI) is a screening tool to identify possible weight problems. It provides an estimate of body fat based on height and weight. Your caregiver can help determine your BMI, and can help you achieve or maintain a healthy weight. For adults 20 years and older:  A BMI below 18.5 is considered underweight.  A BMI of 18.5 to 24.9 is normal.  A BMI of 25 to 29.9 is considered overweight.  A BMI of 30 and above is considered obese.  Maintain normal blood lipids and cholesterol by exercising and minimizing your intake of  saturated fat. Eat a balanced diet with plenty of fruits and vegetables. Blood tests for lipids and cholesterol should begin at age 104 and be repeated every 5 years. If your lipid or cholesterol levels are high, you are over 50, or you are a high risk for heart disease, you may need your cholesterol levels checked more frequently.Ongoing high lipid and cholesterol levels should be treated with medicines if diet and exercise are not effective.  If you smoke, find out from your caregiver how to quit. If you do not use tobacco, do not start.  Lung cancer screening is recommended for adults aged 65 80 years who are at high risk for developing lung cancer because of a history of smoking. Yearly low-dose computed tomography (CT) is recommended for people who have at least a 30-pack-year history of smoking and are a current smoker or have quit within the past 15 years. A pack year of smoking is smoking an average of 1 pack of cigarettes a day for 1 year (for example: 1 pack a day for 30 years or 2 packs a day for 15 years). Yearly screening should continue until the smoker has stopped smoking for at least 15 years. Yearly screening should also be stopped for people who develop a health problem that would prevent them from having lung cancer treatment.  If you are pregnant, do not drink alcohol. If you are breastfeeding, be very cautious about drinking alcohol. If you are not pregnant and choose to drink alcohol, do not exceed 1 drink per day. One drink is  considered to be 12 ounces (355 mL) of beer, 5 ounces (148 mL) of wine, or 1.5 ounces (44 mL) of liquor.  Avoid use of street drugs. Do not share needles with anyone. Ask for help if you need support or instructions about stopping the use of drugs.  High blood pressure causes heart disease and increases the risk of stroke. Blood pressure should be checked at least every 1 to 2 years. Ongoing high blood pressure should be treated with medicines, if weight loss  and exercise are not effective.  If you are 62 to 49 years old, ask your caregiver if you should take aspirin to prevent strokes.  Diabetes screening involves taking a blood sample to check your fasting blood sugar level. This should be done once every 3 years, after age 63, if you are within normal weight and without risk factors for diabetes. Testing should be considered at a younger age or be carried out more frequently if you are overweight and have at least 1 risk factor for diabetes.  Breast cancer screening is essential preventative care for women. You should practice "breast self-awareness." This means understanding the normal appearance and feel of your breasts and may include breast self-examination. Any changes detected, no matter how small, should be reported to a caregiver. Women in their 74s and 30s should have a clinical breast exam (CBE) by a caregiver as part of a regular health exam every 1 to 3 years. After age 61, women should have a CBE every year. Starting at age 29, women should consider having a mammogram (breast X-ray) every year. Women who have a family history of breast cancer should talk to their caregiver about genetic screening. Women at a high risk of breast cancer should talk to their caregiver about having an MRI and a mammogram every year.  Breast cancer gene (BRCA)-related cancer risk assessment is recommended for women who have family members with BRCA-related cancers. BRCA-related cancers include breast, ovarian, tubal, and peritoneal cancers. Having family members with these cancers may be associated with an increased risk for harmful changes (mutations) in the breast cancer genes BRCA1 and BRCA2. Results of the assessment will determine the need for genetic counseling and BRCA1 and BRCA2 testing.  The Pap test is a screening test for cervical cancer. Women should have a Pap test starting at age 29. Between ages 88 and 26, Pap tests should be repeated every 2 years.  Beginning at age 32, you should have a Pap test every 3 years as long as the past 3 Pap tests have been normal. If you had a hysterectomy for a problem that was not cancer or a condition that could lead to cancer, then you no longer need Pap tests. If you are between ages 41 and 52, and you have had normal Pap tests going back 10 years, you no longer need Pap tests. If you have had past treatment for cervical cancer or a condition that could lead to cancer, you need Pap tests and screening for cancer for at least 20 years after your treatment. If Pap tests have been discontinued, risk factors (such as a new sexual partner) need to be reassessed to determine if screening should be resumed. Some women have medical problems that increase the chance of getting cervical cancer. In these cases, your caregiver may recommend more frequent screening and Pap tests.  The human papillomavirus (HPV) test is an additional test that may be used for cervical cancer screening. The HPV test looks for the  virus that can cause the cell changes on the cervix. The cells collected during the Pap test can be tested for HPV. The HPV test could be used to screen women aged 26 years and older, and should be used in women of any age who have unclear Pap test results. After the age of 54, women should have HPV testing at the same frequency as a Pap test.  Colorectal cancer can be detected and often prevented. Most routine colorectal cancer screening begins at the age of 29 and continues through age 86. However, your caregiver may recommend screening at an earlier age if you have risk factors for colon cancer. On a yearly basis, your caregiver may provide home test kits to check for hidden blood in the stool. Use of a small camera at the end of a tube, to directly examine the colon (sigmoidoscopy or colonoscopy), can detect the earliest forms of colorectal cancer. Talk to your caregiver about this at age 33, when routine screening begins.  Direct examination of the colon should be repeated every 5 to 10 years through age 59, unless early forms of pre-cancerous polyps or small growths are found.  Hepatitis C blood testing is recommended for all people born from [birth year] through 1966 and any individual with known risks for hepatitis C.  Practice safe sex. Use condoms and avoid high-risk sexual practices to reduce the spread of sexually transmitted infections (STIs). Sexually active women aged 84 and younger should be checked for Chlamydia, which is a common sexually transmitted infection. Older women with new or multiple partners should also be tested for Chlamydia. Testing for other STIs is recommended if you are sexually active and at increased risk.  Osteoporosis is a disease in which the bones lose minerals and strength with aging. This can result in serious bone fractures. The risk of osteoporosis can be identified using a bone density scan. Women ages 27 and over and women at risk for fractures or osteoporosis should discuss screening with their caregivers. Ask your caregiver whether you should be taking a calcium supplement or vitamin D to reduce the rate of osteoporosis.  Menopause can be associated with physical symptoms and risks. Hormone replacement therapy is available to decrease symptoms and risks. You should talk to your caregiver about whether hormone replacement therapy is right for you.  Use sunscreen. Apply sunscreen liberally and repeatedly throughout the day. You should seek shade when your shadow is shorter than you. Protect yourself by wearing long sleeves, pants, a wide-brimmed hat, and sunglasses year round, whenever you are outdoors.  Notify your caregiver of new moles or changes in moles, especially if there is a change in shape or color. Also notify your caregiver if a mole is larger than the size of a pencil eraser.  Stay current with your immunizations. Document Released: 11/11/2010 Document Revised: 08/23/2012  Document Reviewed: 11/11/2010 [facility] Patient Information 2014 [place].

## 2014-05-10 NOTE — Progress Notes (Signed)
April [name] June 22, 1965 [id number]        48 y.o.  G0P0 for annual exam.  Several issues noted below.  Past medical history,surgical history, problem list, medications, allergies, family history and social history were all reviewed and documented as reviewed in the EPIC chart.  ROS:  Performed with pertinent positives and negatives included in the history, assessment and plan.   Additional significant findings :  none   Exam: April [name] Vitals:   05/10/14 0840  BP: 130/84  Height: 5\' 10"  (1.778 m)  Weight: 184 lb (83.462 kg)   General appearance:  Normal affect, orientation and appearance. Skin: Grossly normal HEENT: Without gross lesions.  No cervical or supraclavicular adenopathy. Thyroid normal.  Lungs:  Clear without wheezing, rales or rhonchi Cardiac: RR, without RMG Abdominal:  Soft, nontender, without masses, guarding, rebound, organomegaly or hernia Breasts:  Examined lying and sitting without masses, retractions, discharge or axillary adenopathy. Pelvic:  Ext/BUS/vagina normal  Cervix normal. Pap/HPV  Uterus anteverted, normal size, shape and contour, midline and mobile nontender   Adnexa  Without masses or tenderness    Anus and perineum  Normal   Rectovaginal  Normal sphincter tone without palpated masses or tenderness.    Assessment/Plan:  48 y.o. G0P0 female for annual exam with regular menses, oral contraceptives.   1. Patient continues on LoLoestrin doing well.  Continues these for menstrual suppression. Had been on Mirena IUD doing well but started having irregular bleeding and ultimately was found to have endometrial polyps and her IUD appeared to have shifted. Her IUD and polyps were removed 2014. Now with regular light menses. Current partner with vasectomy. Discussed alternatives to include endometrial ablation and trial IUD again.  Her IUD previously appeared to have shifted and the possible recurrence risk of this discussed although was no issue  removing it. At this point patient wants to continue on the oral contraceptives but will call if she wants to pursue an alternative. Increased risk of blood clots to include stroke heart attack DVT reviewed. Prescription refill 1 year. 2. Pap smear 2012. Pap/HPV today. No history of abnormal Pap smears previously. Repeat at 3-5 year interval per current screening guidelines assuming this Pap smear is normal. 3. Mammography 02/2014. Continue with annual mammography. SBE monthly reviewed. 4. Colonoscopy 2013. Repeat at their recommended interval. 5. Health maintenance. No routine blood work done as she reports this done at her primary physician's office. Follow up in one year, sooner as needed.     [name] MD, 9:05 AM 05/10/2014

## 2014-05-10 NOTE — Addendum Note (Signed)
Addended by: Burnett Kanaris on: 05/10/2014 09:11 AM   Modules accepted: Orders, SmartSet

## 2014-05-11 LAB — URINALYSIS W MICROSCOPIC + REFLEX CULTURE
Bilirubin Urine: NEGATIVE
CASTS: NONE SEEN
GLUCOSE, UA: NEGATIVE mg/dL
Hgb urine dipstick: NEGATIVE
KETONES UR: NEGATIVE mg/dL
LEUKOCYTES UA: NEGATIVE
NITRITE: NEGATIVE
Protein, ur: NEGATIVE mg/dL
Specific Gravity, Urine: 1.023 (ref 1.005–1.030)
Urobilinogen, UA: 0.2 mg/dL (ref 0.0–1.0)
pH: 5.5 (ref 5.0–8.0)

## 2014-05-11 LAB — CYTOLOGY - PAP

## 2014-05-14 LAB — URINE CULTURE: Colony Count: 75000

## 2014-05-15 ENCOUNTER — Other Ambulatory Visit: Payer: Self-pay | Admitting: Gynecology

## 2014-05-15 ENCOUNTER — Telehealth: Payer: Self-pay

## 2014-05-15 MED ORDER — SULFAMETHOXAZOLE-TRIMETHOPRIM 800-160 MG PO TABS: 1.0000 | ORAL_TABLET | Freq: Two times a day (BID) | ORAL | Status: DC

## 2014-05-15 MED ORDER — FLUCONAZOLE 200 MG PO TABS: 200.0000 mg | ORAL_TABLET | Freq: Every day | ORAL | Status: DC

## 2014-05-15 NOTE — Telephone Encounter (Signed)
I called patient about u/a and she mentioned that she is experiencing another yeast inf. She said she was in beginning of Dec with one and just had her CE Dec 31 but if symptomatic again.  Would like Diflucan and she said last time you had to prescribe 200 mg.  She is also starting Septra per your note below.

## 2014-05-15 NOTE — Telephone Encounter (Signed)
-----   Message from [name], MD sent at 05/15/2014  8:52 AM EST ----- Tell patient that she has some bacteria in her urine. Recommend Septra DS 1 by mouth twice a day 3 days

## 2014-05-15 NOTE — Telephone Encounter (Signed)
Diflucan 200 mg #1 refill 2 okay

## 2014-05-15 NOTE — Telephone Encounter (Signed)
Rx sent to pharmacy   

## 2014-05-19 ENCOUNTER — Encounter: Payer: Self-pay | Admitting: Gynecology

## 2014-07-24 ENCOUNTER — Encounter: Payer: Self-pay | Admitting: Gynecology

## 2014-07-24 ENCOUNTER — Ambulatory Visit (INDEPENDENT_AMBULATORY_CARE_PROVIDER_SITE_OTHER): Payer: 59 | Admitting: Gynecology

## 2014-07-24 VITALS — BP 120/76

## 2014-07-24 DIAGNOSIS — R87810 Cervical high risk human papillomavirus (HPV) DNA test positive: Secondary | ICD-10-CM

## 2014-07-24 DIAGNOSIS — R87610 Atypical squamous cells of undetermined significance on cytologic smear of cervix (ASC-US): Secondary | ICD-10-CM | POA: Diagnosis not present

## 2014-07-24 NOTE — Patient Instructions (Signed)
Office will call you with biopsy results 

## 2014-07-24 NOTE — Progress Notes (Signed)
April [name] [birth year] [id number]        49 y.o.  G0P0 Presents for colposcopy. Patient had her first abnormal Pap smear showing ASCUS with positive high-risk HPV.  Past medical history,surgical history, problem list, medications, allergies, family history and social history were all reviewed and documented in the EPIC chart.  Directed ROS with pertinent positives and negatives documented in the history of present illness/assessment and plan.  Exam: Kim assistant Filed Vitals:   07/24/14 1601  BP: 120/76   General appearance:  Normal External BUS vagina normal. Cervix normal. Uterus normal size midline mobile. Adnexa without masses/tenderness. Physical Exam  Genitourinary:       Colposcopy performed, acetic acid cleanse was adequate with small endocervical polyp and translucent epithelium at 9 to 12:00. Endocervical polyp biopsied off. Biopsy at 10:00 taken. Patient tolerated well.   Assessment/Plan:  49 y.o. G0P0 with history and colposcopy as above. I had a lengthy discussion with the patient about dysplasia high-grade/low-grade, progression/regression and the positive HPV association. Patient will follow up for biopsy results. If negative then plan repeat Pap smear in one year at her annual exam. If otherwise then will triage based upon results.     [name] MD, 4:34 PM 07/24/2014

## 2014-07-28 ENCOUNTER — Encounter: Payer: Self-pay | Admitting: Gynecology

## 2014-10-27 ENCOUNTER — Encounter: Payer: Self-pay | Admitting: Gastroenterology

## 2015-01-17 ENCOUNTER — Telehealth: Payer: Self-pay | Admitting: *Deleted

## 2015-01-17 MED ORDER — NORETHIN-ETH ESTRAD-FE BIPHAS 1 MG-10 MCG / 10 MCG PO TABS: ORAL_TABLET | ORAL | Status: DC

## 2015-01-17 MED ORDER — FLUCONAZOLE 150 MG PO TABS
ORAL_TABLET | ORAL | Status: DC
Start: 2015-01-17 — End: 2015-05-16

## 2015-01-17 NOTE — Telephone Encounter (Signed)
Pt aware, Rx sent to pharmacy

## 2015-01-17 NOTE — Telephone Encounter (Signed)
Okay 

## 2015-01-17 NOTE — Telephone Encounter (Signed)
Pt will be leaving for [place] x 1 month requesting refill on Diflucan 150 #12 1 po weekly Rx, appears this was last prescribed in Dec. 2015. Please advise

## 2015-02-12 ENCOUNTER — Other Ambulatory Visit: Payer: Self-pay | Admitting: Gynecology

## 2015-02-19 ENCOUNTER — Other Ambulatory Visit: Payer: Self-pay

## 2015-02-19 DIAGNOSIS — Z1231 Encounter for screening mammogram for malignant neoplasm of breast: Secondary | ICD-10-CM

## 2015-03-13 ENCOUNTER — Ambulatory Visit
Admission: RE | Admit: 2015-03-13 | Discharge: 2015-03-13 | Disposition: A | Discharge status: Home / Self Care | Payer: 59 | Source: Ambulatory Visit

## 2015-03-13 DIAGNOSIS — Z1231 Encounter for screening mammogram for malignant neoplasm of breast: Secondary | ICD-10-CM

## 2015-05-16 ENCOUNTER — Encounter: Payer: Self-pay | Admitting: Gynecology

## 2015-05-16 ENCOUNTER — Other Ambulatory Visit (HOSPITAL_COMMUNITY)
Admission: RE | Admit: 2015-05-16 | Discharge: 2015-05-16 | Disposition: A | Discharge status: Home / Self Care | Payer: 59 | Source: Ambulatory Visit | Attending: Gynecology | Admitting: Gynecology

## 2015-05-16 ENCOUNTER — Ambulatory Visit (INDEPENDENT_AMBULATORY_CARE_PROVIDER_SITE_OTHER): Payer: 59 | Admitting: Gynecology

## 2015-05-16 VITALS — BP 120/74 | Ht 69.0 in | Wt 200.0 lb

## 2015-05-16 DIAGNOSIS — Z01419 Encounter for gynecological examination (general) (routine) without abnormal findings: Secondary | ICD-10-CM | POA: Diagnosis not present

## 2015-05-16 DIAGNOSIS — Z01411 Encounter for gynecological examination (general) (routine) with abnormal findings: Secondary | ICD-10-CM | POA: Diagnosis present

## 2015-05-16 DIAGNOSIS — N898 Other specified noninflammatory disorders of vagina: Secondary | ICD-10-CM

## 2015-05-16 DIAGNOSIS — R896 Abnormal cytological findings in specimens from other organs, systems and tissues: Secondary | ICD-10-CM

## 2015-05-16 DIAGNOSIS — Z1151 Encounter for screening for human papillomavirus (HPV): Secondary | ICD-10-CM | POA: Insufficient documentation

## 2015-05-16 DIAGNOSIS — N926 Irregular menstruation, unspecified: Secondary | ICD-10-CM

## 2015-05-16 DIAGNOSIS — IMO0002 Reserved for concepts with insufficient information to code with codable children: Secondary | ICD-10-CM

## 2015-05-16 LAB — WET PREP FOR TRICH, YEAST, CLUE: Trich, Wet Prep: NONE SEEN

## 2015-05-16 MED ORDER — FLUCONAZOLE 150 MG PO TABS: 150.0000 mg | ORAL_TABLET | ORAL | Status: DC

## 2015-05-16 NOTE — Patient Instructions (Signed)
Take the Diflucan pill once weekly for the next 3 months to see if we cannot suppress the recurrent yeast infections.  Follow up in one month or so to have the blood work to check to see about menopause. Call me within 2 days afterwards if you do not hear from the office.  You may obtain a copy of any labs that were done today by logging onto MyChart as outlined in the instructions provided with your AVS (after visit summary). The office will not call with normal lab results but certainly if there are any significant abnormalities then we will contact you.   Health Maintenance Adopting a healthy lifestyle and getting preventive care can go a long way to promote health and wellness. Talk with your health care provider about what schedule of regular examinations is right for you. This is a good chance for you to check in with your provider about disease prevention and staying healthy. In between checkups, there are plenty of things you can do on your own. Experts have done a lot of research about which lifestyle changes and preventive measures are most likely to keep you healthy. Ask your health care provider for more information. WEIGHT AND DIET  Eat a healthy diet  Be sure to include plenty of vegetables, fruits, low-fat dairy products, and lean protein.  Do not eat a lot of foods high in solid fats, added sugars, or salt.  Get regular exercise. This is one of the most important things you can do for your health.  Most adults should exercise for at least 150 minutes each week. The exercise should increase your heart rate and make you sweat (moderate-intensity exercise).  Most adults should also do strengthening exercises at least twice a week. This is in addition to the moderate-intensity exercise.  Maintain a healthy weight  Body mass index (BMI) is a measurement that can be used to identify possible weight problems. It estimates body fat based on height and weight. Your health care provider  can help determine your BMI and help you achieve or maintain a healthy weight.  For females 65 years of age and older:   A BMI below 18.5 is considered underweight.  A BMI of 18.5 to 24.9 is normal.  A BMI of 25 to 29.9 is considered overweight.  A BMI of 30 and above is considered obese.  Watch levels of cholesterol and blood lipids  You should start having your blood tested for lipids and cholesterol at 51 years of age, then have this test every 5 years.  You may need to have your cholesterol levels checked more often if:  Your lipid or cholesterol levels are high.  You are older than 51 years of age.  You are at high risk for heart disease.  CANCER SCREENING   Lung Cancer  Lung cancer screening is recommended for adults 52-20 years old who are at high risk for lung cancer because of a history of smoking.  A yearly low-dose CT scan of the lungs is recommended for people who:  Currently smoke.  Have quit within the past 15 years.  Have at least a 30-pack-year history of smoking. A pack year is smoking an average of one pack of cigarettes a day for 1 year.  Yearly screening should continue until it has been 15 years since you quit.  Yearly screening should stop if you develop a health problem that would prevent you from having lung cancer treatment.  Breast Cancer  Practice breast self-awareness.  This means understanding how your breasts normally appear and feel.  It also means doing regular breast self-exams. Let your health care provider know about any changes, no matter how small.  If you are in your 20s or 30s, you should have a clinical breast exam (CBE) by a health care provider every 1-3 years as part of a regular health exam.  If you are 56 or older, have a CBE every year. Also consider having a breast X-ray (mammogram) every year.  If you have a family history of breast cancer, talk to your health care provider about genetic screening.  If you are at  high risk for breast cancer, talk to your health care provider about having an MRI and a mammogram every year.  Breast cancer gene (BRCA) assessment is recommended for women who have family members with BRCA-related cancers. BRCA-related cancers include:  Breast.  Ovarian.  Tubal.  Peritoneal cancers.  Results of the assessment will determine the need for genetic counseling and BRCA1 and BRCA2 testing. Cervical Cancer Routine pelvic examinations to screen for cervical cancer are no longer recommended for nonpregnant women who are considered low risk for cancer of the pelvic organs (ovaries, uterus, and vagina) and who do not have symptoms. A pelvic examination may be necessary if you have symptoms including those associated with pelvic infections. Ask your health care provider if a screening pelvic exam is right for you.   The Pap test is the screening test for cervical cancer for women who are considered at risk.  If you had a hysterectomy for a problem that was not cancer or a condition that could lead to cancer, then you no longer need Pap tests.  If you are older than 65 years, and you have had normal Pap tests for the past 10 years, you no longer need to have Pap tests.  If you have had past treatment for cervical cancer or a condition that could lead to cancer, you need Pap tests and screening for cancer for at least 20 years after your treatment.  If you no longer get a Pap test, assess your risk factors if they change (such as having a new sexual partner). This can affect whether you should start being screened again.  Some women have medical problems that increase their chance of getting cervical cancer. If this is the case for you, your health care provider may recommend more frequent screening and Pap tests.  The human papillomavirus (HPV) test is another test that may be used for cervical cancer screening. The HPV test looks for the virus that can cause cell changes in the  cervix. The cells collected during the Pap test can be tested for HPV.  The HPV test can be used to screen women 67 years of age and older. Getting tested for HPV can extend the interval between normal Pap tests from three to five years.  An HPV test also should be used to screen women of any age who have unclear Pap test results.  After 51 years of age, women should have HPV testing as often as Pap tests.  Colorectal Cancer  This type of cancer can be detected and often prevented.  Routine colorectal cancer screening usually begins at 51 years of age and continues through 51 years of age.  Your health care provider may recommend screening at an earlier age if you have risk factors for colon cancer.  Your health care provider may also recommend using home test kits to check for  hidden blood in the stool.  A small camera at the end of a tube can be used to examine your colon directly (sigmoidoscopy or colonoscopy). This is done to check for the earliest forms of colorectal cancer.  Routine screening usually begins at age 39.  Direct examination of the colon should be repeated every 5-10 years through 51 years of age. However, you may need to be screened more often if early forms of precancerous polyps or small growths are found. Skin Cancer  Check your skin from head to toe regularly.  Tell your health care provider about any new moles or changes in moles, especially if there is a change in a mole's shape or color.  Also tell your health care provider if you have a mole that is larger than the size of a pencil eraser.  Always use sunscreen. Apply sunscreen liberally and repeatedly throughout the day.  Protect yourself by wearing long sleeves, pants, a wide-brimmed hat, and sunglasses whenever you are outside. HEART DISEASE, DIABETES, AND HIGH BLOOD PRESSURE   Have your blood pressure checked at least every 1-2 years. High blood pressure causes heart disease and increases the risk  of stroke.  If you are between 54 years and 13 years old, ask your health care provider if you should take aspirin to prevent strokes.  Have regular diabetes screenings. This involves taking a blood sample to check your fasting blood sugar level.  If you are at a normal weight and have a low risk for diabetes, have this test once every three years after 51 years of age.  If you are overweight and have a high risk for diabetes, consider being tested at a younger age or more often. PREVENTING INFECTION  Hepatitis B  If you have a higher risk for hepatitis B, you should be screened for this virus. You are considered at high risk for hepatitis B if:  You were born in a country where hepatitis B is common. Ask your health care provider which countries are considered high risk.  Your parents were born in a high-risk country, and you have not been immunized against hepatitis B (hepatitis B vaccine).  You have HIV or AIDS.  You use needles to inject street drugs.  You live with someone who has hepatitis B.  You have had sex with someone who has hepatitis B.  You get hemodialysis treatment.  You take certain medicines for conditions, including cancer, organ transplantation, and autoimmune conditions. Hepatitis C  Blood testing is recommended for:  Everyone born from [birth year] through 1966.  Anyone with known risk factors for hepatitis C. Sexually transmitted infections (STIs)  You should be screened for sexually transmitted infections (STIs) including gonorrhea and chlamydia if:  You are sexually active and are younger than 51 years of age.  You are older than 52 years of age and your health care provider tells you that you are at risk for this type of infection.  Your sexual activity has changed since you were last screened and you are at an increased risk for chlamydia or gonorrhea. Ask your health care provider if you are at risk.  If you do not have HIV, but are at risk, it may be  recommended that you take a prescription medicine daily to prevent HIV infection. This is called pre-exposure prophylaxis (PrEP). You are considered at risk if:  You are sexually active and do not regularly use condoms or know the HIV status of your partner(s).  You take drugs by  injection.  You are sexually active with a partner who has HIV. Talk with your health care provider about whether you are at high risk of being infected with HIV. If you choose to begin PrEP, you should first be tested for HIV. You should then be tested every 3 months for as long as you are taking PrEP.  PREGNANCY   If you are premenopausal and you may become pregnant, ask your health care provider about preconception counseling.  If you may become pregnant, take 400 to 800 micrograms (mcg) of folic acid every day.  If you want to prevent pregnancy, talk to your health care provider about birth control (contraception). OSTEOPOROSIS AND MENOPAUSE   Osteoporosis is a disease in which the bones lose minerals and strength with aging. This can result in serious bone fractures. Your risk for osteoporosis can be identified using a bone density scan.  If you are 37 years of age or older, or if you are at risk for osteoporosis and fractures, ask your health care provider if you should be screened.  Ask your health care provider whether you should take a calcium or vitamin D supplement to lower your risk for osteoporosis.  Menopause may have certain physical symptoms and risks.  Hormone replacement therapy may reduce some of these symptoms and risks. Talk to your health care provider about whether hormone replacement therapy is right for you.  HOME CARE INSTRUCTIONS   Schedule regular health, dental, and eye exams.  Stay current with your immunizations.   Do not use any tobacco products including cigarettes, chewing tobacco, or electronic cigarettes.  If you are pregnant, do not drink alcohol.  If you are  breastfeeding, limit how much and how often you drink alcohol.  Limit alcohol intake to no more than 1 drink per day for nonpregnant women. One drink equals 12 ounces of beer, 5 ounces of wine, or 1 ounces of hard liquor.  Do not use street drugs.  Do not share needles.  Ask your health care provider for help if you need support or information about quitting drugs.  Tell your health care provider if you often feel depressed.  Tell your health care provider if you have ever been abused or do not feel safe at home. Document Released: 11/11/2010 Document Revised: 09/12/2013 Document Reviewed: 03/30/2013 [facility] Patient Information 2015 [place], [place]. This information is not intended to replace advice given to you by your health care provider. Make sure you discuss any questions you have with your health care provider.

## 2015-05-16 NOTE — Progress Notes (Signed)
Robbie Oishi March 18, 1966 [id number]        50 y.o.  G0P0  for annual exam.  Several issues noted below.  Past medical history,surgical history, problem list, medications, allergies, family history and social history were all reviewed and documented as reviewed in the EPIC chart.  ROS:  Performed with pertinent positives and negatives included in the history, assessment and plan.   Additional significant findings :  none   Exam: [name] assistant Filed Vitals:   05/16/15 1557  BP: 120/74  Height: 5\' 9"  (1.753 m)  Weight: 200 lb (90.719 kg)   General appearance:  Normal affect, orientation and appearance. Skin: Grossly normal HEENT: Without gross lesions.  No cervical or supraclavicular adenopathy. Thyroid normal.  Lungs:  Clear without wheezing, rales or rhonchi Cardiac: RR, without RMG Abdominal:  Soft, nontender, without masses, guarding, rebound, organomegaly or hernia Breasts:  Examined lying and sitting without masses, retractions, discharge or axillary adenopathy. Pelvic:  Ext/BUS/vagina with white discharge.  Cervix normal. Pap smear/HPV done  Uterus anteverted, normal size, shape and contour, midline and mobile nontender   Adnexa  Without masses or tenderness    Anus and perineum  Normal   Rectovaginal  Normal sphincter tone without palpated masses or tenderness.    Assessment/Plan:  50 y.o. G0P0 female for annual exam with menses every 3 months, oral contraception.   1. Patient using Lo Loestrin for menstrual regulation.  Partner has vasectomy. Is doing well with this. Using an every 3 month withdrawal. Reviewed the issues again of oral contraceptives with age and history of smoking quit approximately 16-17 years ago. Possible increased risks of stroke heart attack DVT particular with past smoking history discussed. Recommend stopping her pills now and checking an Jaconita in one month to see where we stand and then decide where to go from there. If normal then she will want  to continue the pills another year accepting the risks of thrombosis. If elevated then we'll plan menstrual calendar for now. 2. Vaginal irritation. Patient feels like she is developing another yeast infection. Has a history of recurrent frequent yeast that she treats intermittently. Wet prep is positive for yeast. Will cover with Diflucan 150 mg. Recommend weekly suppressive therapy for the next 12 weeks and see how she does with this. #12 provided. 3. History LGSIL. Patient had first abnormal Pap smear last year showing ASCUS with positive high-risk HPV. Colposcopy with biopsy 07/2014 showed LGSIL with negative ECC. Pap smear/HPV done today.  Will triage based upon results. 4. Mammography 03/2015. Continue with annual mammography when due. SBE monthly reviewed. 5. Colonoscopy 2013. Repeat at their recommended interval. 6. Health maintenance. No routine blood work done as this is done at her primary physician's office. Follow up for [facility] in one month otherwise annual exam in one year.   [name] MD, 4:30 PM 05/16/2015

## 2015-05-16 NOTE — Addendum Note (Signed)
Addended by: Nelva Nay on: 05/16/2015 04:40 PM   Modules accepted: Orders

## 2015-05-17 ENCOUNTER — Telehealth: Payer: Self-pay | Admitting: *Deleted

## 2015-05-17 LAB — URINALYSIS W MICROSCOPIC + REFLEX CULTURE
BACTERIA UA: NONE SEEN [HPF]
Bilirubin Urine: NEGATIVE
CASTS: NONE SEEN [LPF]
CRYSTALS: NONE SEEN [HPF]
Glucose, UA: NEGATIVE
HGB URINE DIPSTICK: NEGATIVE
KETONES UR: NEGATIVE
Leukocytes, UA: NEGATIVE
NITRITE: NEGATIVE
PH: 7 (ref 5.0–8.0)
PROTEIN: NEGATIVE
RBC / HPF: NONE SEEN RBC/HPF (ref <=2)
SQUAMOUS EPITHELIAL / LPF: NONE SEEN [HPF] (ref <=5)
Specific Gravity, Urine: 1.011 (ref 1.001–1.035)
WBC, UA: NONE SEEN WBC/HPF (ref <=5)
YEAST: NONE SEEN [HPF]

## 2015-05-17 NOTE — Telephone Encounter (Signed)
Pt called stating she only received 2 pills of diflucan from pharmacy, after OV 05/16/15. I called [facility] and spoke with pharmacist and was informed that her insurance will only allow for 2 pills at a time, she has the remaining 10 pills at pharmacy.

## 2015-05-21 LAB — CYTOLOGY - PAP

## 2015-05-22 ENCOUNTER — Encounter: Payer: Self-pay | Admitting: Gynecology

## 2015-06-22 ENCOUNTER — Other Ambulatory Visit: Payer: 59

## 2015-06-22 DIAGNOSIS — N926 Irregular menstruation, unspecified: Secondary | ICD-10-CM

## 2015-06-22 LAB — FOLLICLE STIMULATING HORMONE: FSH: 5.9 m[IU]/mL

## 2015-07-03 ENCOUNTER — Telehealth: Payer: Self-pay

## 2015-07-03 MED ORDER — NORETHIN-ETH ESTRAD-FE BIPHAS 1 MG-10 MCG / 10 MCG PO TABS: 1.0000 | ORAL_TABLET | Freq: Every day | ORAL | Status: DC

## 2015-07-03 NOTE — Telephone Encounter (Signed)
Notes Recorded by [name], MD on 06/26/2015 at 10:32 AM Tell patient her [facility] was normal. 2 options would be to go back on the pill for one year. Second option would be to stay off the pill as she is using vasectomy for birth control and see what her cycles do. If she chooses this then call me in 3 months to let me know what is going on. If she chooses to go back on the low-dose oral contraceptives main risk is blood clots such as stroke, heart attack, DVT. I would use which she was prescribed previously as she seems to with this.   Patient and I talked last week regarding the note above. She called today because she has decided to go back on OC's as this cycle very heavy. She asked me to call single pack to local pharmacy and 90 day supply to mail order.  Rx's sent.

## 2015-10-03 ENCOUNTER — Other Ambulatory Visit: Payer: Self-pay | Admitting: Gynecology

## 2016-01-18 ENCOUNTER — Other Ambulatory Visit: Payer: Self-pay | Admitting: Gynecology

## 2016-01-18 NOTE — Telephone Encounter (Signed)
I received a refill request for her Diflucan. We treated her to beginning of the year for 3 months. I'm assuming that it got better until now but check to see what symptoms she is having.

## 2016-01-24 ENCOUNTER — Other Ambulatory Visit: Payer: Self-pay | Admitting: Gynecology

## 2016-01-25 NOTE — Telephone Encounter (Signed)
Refill okay?  

## 2016-01-25 NOTE — Telephone Encounter (Signed)
Rx sent 

## 2016-02-11 ENCOUNTER — Other Ambulatory Visit: Payer: Self-pay | Admitting: Gynecology

## 2016-02-11 DIAGNOSIS — Z1231 Encounter for screening mammogram for malignant neoplasm of breast: Secondary | ICD-10-CM

## 2016-03-14 ENCOUNTER — Ambulatory Visit: Payer: 59

## 2016-04-07 ENCOUNTER — Ambulatory Visit
Admission: RE | Admit: 2016-04-07 | Discharge: 2016-04-07 | Disposition: A | Discharge status: Home / Self Care | Payer: 59 | Source: Ambulatory Visit | Attending: Gynecology | Admitting: Gynecology

## 2016-04-07 DIAGNOSIS — Z1231 Encounter for screening mammogram for malignant neoplasm of breast: Secondary | ICD-10-CM

## 2016-04-21 ENCOUNTER — Other Ambulatory Visit: Payer: Self-pay | Admitting: Gynecology

## 2016-05-16 ENCOUNTER — Encounter: Payer: Self-pay | Admitting: Gynecology

## 2016-05-16 ENCOUNTER — Ambulatory Visit (INDEPENDENT_AMBULATORY_CARE_PROVIDER_SITE_OTHER): Payer: 59 | Admitting: Gynecology

## 2016-05-16 VITALS — BP 118/76 | Ht 69.0 in | Wt 213.0 lb

## 2016-05-16 DIAGNOSIS — R945 Abnormal results of liver function studies: Secondary | ICD-10-CM

## 2016-05-16 DIAGNOSIS — R7989 Other specified abnormal findings of blood chemistry: Secondary | ICD-10-CM

## 2016-05-16 DIAGNOSIS — R87810 Cervical high risk human papillomavirus (HPV) DNA test positive: Secondary | ICD-10-CM

## 2016-05-16 DIAGNOSIS — Z1151 Encounter for screening for human papillomavirus (HPV): Secondary | ICD-10-CM | POA: Diagnosis not present

## 2016-05-16 DIAGNOSIS — N760 Acute vaginitis: Secondary | ICD-10-CM

## 2016-05-16 DIAGNOSIS — Z01419 Encounter for gynecological examination (general) (routine) without abnormal findings: Secondary | ICD-10-CM | POA: Diagnosis not present

## 2016-05-16 DIAGNOSIS — R87610 Atypical squamous cells of undetermined significance on cytologic smear of cervix (ASC-US): Secondary | ICD-10-CM

## 2016-05-16 DIAGNOSIS — Z309 Encounter for contraceptive management, unspecified: Secondary | ICD-10-CM

## 2016-05-16 LAB — COMPREHENSIVE METABOLIC PANEL
ALK PHOS: 61 U/L (ref 33–130)
ALT: 13 U/L (ref 6–29)
AST: 17 U/L (ref 10–35)
Albumin: 3.8 g/dL (ref 3.6–5.1)
BUN: 12 mg/dL (ref 7–25)
CO2: 27 mmol/L (ref 20–31)
Calcium: 9.3 mg/dL (ref 8.6–10.4)
Chloride: 105 mmol/L (ref 98–110)
Creat: 0.83 mg/dL (ref 0.50–1.05)
GLUCOSE: 96 mg/dL (ref 65–99)
POTASSIUM: 4.8 mmol/L (ref 3.5–5.3)
SODIUM: 140 mmol/L (ref 135–146)
Total Bilirubin: 0.2 mg/dL (ref 0.2–1.2)
Total Protein: 6.4 g/dL (ref 6.1–8.1)

## 2016-05-16 MED ORDER — NORETHIN-ETH ESTRAD-FE BIPHAS 1 MG-10 MCG / 10 MCG PO TABS: 1.0000 | ORAL_TABLET | Freq: Every day | ORAL | 0 refills | Status: DC

## 2016-05-16 NOTE — Progress Notes (Addendum)
April [name] 02-02-1966 [id number]        51 y.o.  G0P0 for annual exam.  Several issues as noted below.  Past medical history,surgical history, problem list, medications, allergies, family history and social history were all reviewed and documented as reviewed in the EPIC chart.  ROS:  Performed with pertinent positives and negatives included in the history, assessment and plan.   Additional significant findings :  None   Exam: [name] assistant Vitals:   05/16/16 1553  BP: 118/76  Weight: 213 lb (96.6 kg)  Height: 5\' 9"  (1.753 m)   Body mass index is 31.45 kg/m.  General appearance:  Normal affect, orientation and appearance. Skin: Grossly normal HEENT: Without gross lesions.  No cervical or supraclavicular adenopathy. Thyroid normal.  Lungs:  Clear without wheezing, rales or rhonchi Cardiac: RR, without RMG Abdominal:  Soft, nontender, without masses, guarding, rebound, organomegaly or hernia Breasts:  Examined lying and sitting without masses, retractions, discharge or axillary adenopathy. Pelvic:  Ext, BUS, Vagina normal  Cervix normal. Pap smear done  Uterus anteverted, normal size, shape and contour, midline and mobile nontender   Adnexa without masses or tenderness    Anus and perineum normal   Rectovaginal normal sphincter tone without palpated masses or tenderness.    Assessment/Plan:  51 y.o. G0P0 female for annual exam with menses every 3 months, vasectomy birth control.   1. Oral contraceptives. Patient on Lo Loestrin 1/10 for menstrual suppression due to heavy menses. Had tried various options in the past include failed IUD. Stopped her pills last year for a month with return of heavy menses. Had a normal [place] checked during this time. Does have a history of smoking but quit a number of years ago. Also has a history of elevated LFTs worked up in the past although has not had them checked in several years historically. Also has small 2-3 cm hemangioma that  had been followed serially over the past years with MRI and ultrasounds and has remained stable.  I discussed with her that hepatic adenomas may be associated with BCPs but hemangiomas probably are unrelated but no guarantees. I again reviewed options with the patient to include stopping the birth control pills now, try something different for menstrual regulation such as another Mirena IUD or continuing on the birth control pills understanding and accepting the risks to include increased risk of thrombosis such as stroke heart attack DVT and the issues that it may contribute to her elevated LFTs/hemangioma. At this point patient wants to continue. She will return within the next 3 months for a end of pill free week [facility] check. 3 month supply for the pills divided with no refills. Will also check baseline LFTs 2. Chronic vaginitis. Has been using Diflucan intermittently with good response. Currently without symptoms. Has been given supplies to include up to #12. Has tried boric acid suppression before but this was number of years ago. Recommended trial of boric acid now 600 mg capsules #30 with one refill to use twice weekly to see if this does not suppress recurrences. 3. History of LGSIL. First abnormal Pap smear 2016 showed ASCUS with positive high-risk HPV. Follow up colposcopic biopsy LGSIL with negative ECC. Pap smear last year showed ASCUS with positive high-risk HPV. Pap smear/HPV done today. Follow up for results. 4. Mammography 03/2016. Continue with annual mammography when due. SBE monthly reviewed. 5. Colonoscopy 2013. Repeat at their recommended interval. 6. Health maintenance. No routine lab work done as patient reports  this done elsewhere. Follow up for [facility] check and if any issues once starting the boric acid suppositories.  Otherwise follow up 1 year for annual exam  Additional time in excess of her routine gynecologic exam was spent in direct face to face counseling and coordination of care in  regards to her birth control pill management and management of her chronic vaginitis.    [name] MD, 4:29 PM 05/16/2016

## 2016-05-16 NOTE — Patient Instructions (Signed)
Follow up over the next several months during the end of a pill free week to check your hormone level.  Start the boric acid suppositories twice weekly. Call me if you have any issues with this.

## 2016-05-19 ENCOUNTER — Telehealth: Payer: Self-pay | Admitting: *Deleted

## 2016-05-19 ENCOUNTER — Encounter: Payer: Self-pay | Admitting: Gynecology

## 2016-05-19 MED ORDER — NONFORMULARY OR COMPOUNDED ITEM: 1 refills | Status: DC

## 2016-05-19 NOTE — Telephone Encounter (Signed)
Rx called in 

## 2016-05-19 NOTE — Telephone Encounter (Signed)
-----   Message from [name], MD sent at 05/16/2016  4:40 PM EST ----- Call in Boric acid 600 mg vaginal suppositories #30 with 1 refill [facility]

## 2016-05-21 LAB — PAP IG AND HPV HIGH-RISK: HPV DNA High Risk: DETECTED — AB

## 2016-05-22 ENCOUNTER — Encounter: Payer: Self-pay | Admitting: Gynecology

## 2016-06-03 ENCOUNTER — Encounter: Payer: Self-pay | Admitting: Gynecology

## 2016-06-03 ENCOUNTER — Ambulatory Visit (INDEPENDENT_AMBULATORY_CARE_PROVIDER_SITE_OTHER): Payer: 59 | Admitting: Gynecology

## 2016-06-03 VITALS — BP 120/76

## 2016-06-03 DIAGNOSIS — R87810 Cervical high risk human papillomavirus (HPV) DNA test positive: Secondary | ICD-10-CM

## 2016-06-03 DIAGNOSIS — B373 Candidiasis of vulva and vagina: Secondary | ICD-10-CM

## 2016-06-03 DIAGNOSIS — B3731 Acute candidiasis of vulva and vagina: Secondary | ICD-10-CM

## 2016-06-03 DIAGNOSIS — R87610 Atypical squamous cells of undetermined significance on cytologic smear of cervix (ASC-US): Secondary | ICD-10-CM | POA: Diagnosis not present

## 2016-06-03 MED ORDER — FLUCONAZOLE 150 MG PO TABS: 150.0000 mg | ORAL_TABLET | ORAL | 4 refills | Status: DC

## 2016-06-03 NOTE — Progress Notes (Signed)
    Kaeliana Pinos [birth year] [id number]        51 y.o.  G0P0 with first abnormal Pap smear 2016 showing ASCUS with positive high-risk HPV. Colposcopy with biopsy showed LGSIL with negative ECC. Pap smear 2017 showed ASCUS with positive high-risk HPV. Pap smear 05/2016 showed ASCUS with positive high-risk HPV. Patient presents now for colposcopy.  Past medical history,surgical history, problem list, medications, allergies, family history and social history were all reviewed and documented in the EPIC chart.  Directed ROS with pertinent positives and negatives documented in the history of present illness/assessment and plan.  Exam: [name] assistant Vitals:   06/03/16 1202  BP: 120/76   General appearance:  Normal Abdomen soft nontender without masses guarding rebound Pelvic external BUS vagina normal. Cervix normal. Uterus normal size midline mobile nontender. Adnexa without masses or tenderness.  Colposcopy after acetic acid cleanse is adequate and normal. No abnormality seen. ECC performed. Patient tolerated well. Physical Exam  Genitourinary:      Assessment/Plan:  51 y.o. G0P0 with persistent low-grade changes and positive high-risk HPV. Colposcopy today is adequate normal. ECC performed. Assuming normal or low-grade and plan expectant management with follow up Pap smear in one year. If otherwise then we'll triage based on results. I reviewed again with the patient the issues of dysplasia, high-grade/low-grade, progression/regression and the HPV association. Patient will follow up for these results in several days.  Patient also had started on boric acid suppositories for recurrent yeast vaginitis. Notes a lot of irritation and mass. Would prefer to go back on Diflucan suppression which she has used in the past successfully. Recent lab work included a normal liver profile. We'll go with Diflucan 150 mg weekly 12 weeks.   [name] MD, 12:17 PM 06/03/2016

## 2016-06-03 NOTE — Patient Instructions (Signed)
Office will call you with biopsy results.  Use the Diflucan pill once weekly to help suppress yeast infections

## 2016-06-04 LAB — PATHOLOGY

## 2016-06-10 ENCOUNTER — Other Ambulatory Visit: Payer: Self-pay | Admitting: Gynecology

## 2016-07-30 ENCOUNTER — Other Ambulatory Visit: Payer: Self-pay | Admitting: Gynecology

## 2016-09-05 ENCOUNTER — Other Ambulatory Visit: Payer: 59

## 2016-09-05 DIAGNOSIS — Z309 Encounter for contraceptive management, unspecified: Secondary | ICD-10-CM

## 2016-09-06 LAB — FOLLICLE STIMULATING HORMONE: FSH: 8.7 m[IU]/mL

## 2016-09-08 ENCOUNTER — Other Ambulatory Visit: Payer: Self-pay

## 2016-09-08 MED ORDER — NORETHIN-ETH ESTRAD-FE BIPHAS 1 MG-10 MCG / 10 MCG PO TABS: 1.0000 | ORAL_TABLET | Freq: Every day | ORAL | 3 refills | Status: DC

## 2016-10-17 ENCOUNTER — Other Ambulatory Visit: Payer: Self-pay | Admitting: Gynecology

## 2016-12-02 ENCOUNTER — Other Ambulatory Visit: Payer: Self-pay | Admitting: Gynecology

## 2017-02-25 ENCOUNTER — Other Ambulatory Visit: Payer: Self-pay | Admitting: Gynecology

## 2017-03-13 ENCOUNTER — Other Ambulatory Visit: Payer: Self-pay | Admitting: Gynecology

## 2017-03-13 DIAGNOSIS — Z139 Encounter for screening, unspecified: Secondary | ICD-10-CM

## 2017-04-10 ENCOUNTER — Ambulatory Visit
Admission: RE | Admit: 2017-04-10 | Discharge: 2017-04-10 | Disposition: A | Discharge status: Home / Self Care | Payer: 59 | Source: Ambulatory Visit | Attending: Gynecology | Admitting: Gynecology

## 2017-04-10 DIAGNOSIS — Z139 Encounter for screening, unspecified: Secondary | ICD-10-CM

## 2017-04-14 ENCOUNTER — Encounter: Payer: Self-pay | Admitting: Gastroenterology

## 2017-05-14 ENCOUNTER — Encounter: Payer: Self-pay | Admitting: Gastroenterology

## 2017-05-18 ENCOUNTER — Encounter: Payer: Self-pay | Admitting: Gynecology

## 2017-05-18 ENCOUNTER — Ambulatory Visit (INDEPENDENT_AMBULATORY_CARE_PROVIDER_SITE_OTHER): Payer: 59 | Admitting: Gynecology

## 2017-05-18 VITALS — BP 118/76 | Ht 68.0 in | Wt 218.0 lb

## 2017-05-18 DIAGNOSIS — Z01411 Encounter for gynecological examination (general) (routine) with abnormal findings: Secondary | ICD-10-CM | POA: Diagnosis not present

## 2017-05-18 DIAGNOSIS — Z1151 Encounter for screening for human papillomavirus (HPV): Secondary | ICD-10-CM | POA: Diagnosis not present

## 2017-05-18 DIAGNOSIS — N952 Postmenopausal atrophic vaginitis: Secondary | ICD-10-CM

## 2017-05-18 MED ORDER — NORETHIN-ETH ESTRAD-FE BIPHAS 1 MG-10 MCG / 10 MCG PO TABS: 1.0000 | ORAL_TABLET | Freq: Every day | ORAL | 4 refills | Status: DC

## 2017-05-18 NOTE — Addendum Note (Signed)
Addended by: Nelva Nay on: 05/18/2017 04:42 PM   Modules accepted: Orders

## 2017-05-18 NOTE — Progress Notes (Signed)
    April [name] Nov 14, 1965 [id number]        52 y.o.  G0P0 for annual gynecologic exam.  Several issues noted below.  Past medical history,surgical history, problem list, medications, allergies, family history and social history were all reviewed and documented as reviewed in the EPIC chart.  ROS:  Performed with pertinent positives and negatives included in the history, assessment and plan.   Additional significant findings : None   Exam: [name] assistant Vitals:   05/18/17 1533  BP: 118/76  Weight: 218 lb (98.9 kg)  Height: 5\' 8"  (1.727 m)   Body mass index is 33.15 kg/m.  General appearance:  Normal affect, orientation and appearance. Skin: Grossly normal HEENT: Without gross lesions.  No cervical or supraclavicular adenopathy. Thyroid normal.  Lungs:  Clear without wheezing, rales or rhonchi Cardiac: RR, without RMG Abdominal:  Soft, nontender, without masses, guarding, rebound, organomegaly or hernia Breasts:  Examined lying and sitting without masses, retractions, discharge or axillary adenopathy. Pelvic:  Ext, BUS, Vagina: Normal  Cervix: Normal.  Pap smear/HPV  Uterus: Anteverted, normal size, shape and contour, midline and mobile nontender   Adnexa: Without masses or tenderness    Anus and perineum: Normal   Rectovaginal: Normal sphincter tone without palpated masses or tenderness.    Assessment/Plan:  52 y.o. G0P0 female for annual gynecologic exam with regular menses, oral contraceptives/vasectomy.   1. Oral contraceptives.  Using Lo Loestrin 1/10 menstrual suppression with every several month withdrawal.  [name] checked earlier this year was normal.  We discussed this in detail multiple times to include the risks of stroke heart attack DVT.  Also has a hemangioma of the liver and the issues of whether this would be associated with this or not.  Thrombosis risks to include stroke heart attack DVT also reviewed.  Ultimately she wants to continue her pills for now  and I refilled her times a year.  We will plan on stopping next year and rechecking [name] 2. History of LGSIL on biopsy 2016.  Pap smear 2017 and 2018 showed ASCUS with positive high risk HPV.  Follow-up colposcopy 05/2016 was negative with negative ECC.  Pap smear/HPV today. 3. Mammography 03/2017.  Continue with annual mammography when due.  Breast exam normal today. 4. Colonoscopy 2013.  Repeat at their recommended interval. 5. Health maintenance.  Does note some difficulty losing weight and some normalized loss of hair.  Her exam shows no evidence of alopecia.  Discussed metabolic changes in the 99J.  Has upcoming annual exam with her primary physician and I recommended that he check her thyroid and she agrees to make sure that they do this.  Reviewed weight loss strategies to include weight watchers.  Dermatology follow-up if hair loss continues to be an issue.  Follow-up in 1 year, sooner as needed.   [name] MD, 3:57 PM 05/18/2017

## 2017-05-18 NOTE — Patient Instructions (Signed)
Follow up in 1 year, sooner as needed.  Next year your primary physician checks your thyroid with your next blood draw.

## 2017-05-20 LAB — PAP IG AND HPV HIGH-RISK: HPV DNA High Risk: NOT DETECTED

## 2017-06-01 ENCOUNTER — Other Ambulatory Visit: Payer: Self-pay

## 2017-06-01 MED ORDER — FLUCONAZOLE 150 MG PO TABS: ORAL_TABLET | ORAL | 0 refills | Status: DC

## 2017-06-19 ENCOUNTER — Other Ambulatory Visit: Payer: Self-pay

## 2017-06-19 ENCOUNTER — Encounter: Payer: Self-pay | Admitting: Gastroenterology

## 2017-06-19 ENCOUNTER — Ambulatory Visit (AMBULATORY_SURGERY_CENTER): Payer: Self-pay | Admitting: *Deleted

## 2017-06-19 VITALS — Ht 68.0 in | Wt 213.0 lb

## 2017-06-19 DIAGNOSIS — Z800 Family history of malignant neoplasm of digestive organs: Secondary | ICD-10-CM

## 2017-06-19 DIAGNOSIS — Z86010 Personal history of colonic polyps: Secondary | ICD-10-CM

## 2017-06-19 MED ORDER — NA SULFATE-K SULFATE-MG SULF 17.5-3.13-1.6 GM/177ML PO SOLN
ORAL | 0 refills | Status: DC
Start: 2017-06-19 — End: 2017-07-03

## 2017-06-19 NOTE — Progress Notes (Signed)
Patient denies any allergies to eggs or soy. Patient denies any problems with anesthesia/sedation. Patient denies any oxygen use at home. Patient denies taking any diet/weight loss medications or blood thinners. EMMI education assisgned to patient on colonoscopy, this was explained and instructions given to patient. Suprep coupon printed and given to pt.  

## 2017-07-01 ENCOUNTER — Other Ambulatory Visit: Payer: Self-pay | Admitting: *Deleted

## 2017-07-01 MED ORDER — NORETHIN-ETH ESTRAD-FE BIPHAS 1 MG-10 MCG / 10 MCG PO TABS: 1.0000 | ORAL_TABLET | Freq: Every day | ORAL | 4 refills | Status: DC

## 2017-07-03 ENCOUNTER — Ambulatory Visit (AMBULATORY_SURGERY_CENTER): Payer: 59 | Admitting: Gastroenterology

## 2017-07-03 ENCOUNTER — Other Ambulatory Visit: Payer: Self-pay

## 2017-07-03 ENCOUNTER — Encounter: Payer: Self-pay | Admitting: Gastroenterology

## 2017-07-03 VITALS — BP 137/86 | HR 56 | Temp 98.0°F | Resp 11 | Ht 68.0 in | Wt 213.0 lb

## 2017-07-03 DIAGNOSIS — Z86010 Personal history of colonic polyps: Secondary | ICD-10-CM

## 2017-07-03 DIAGNOSIS — Z800 Family history of malignant neoplasm of digestive organs: Secondary | ICD-10-CM

## 2017-07-03 MED ORDER — SODIUM CHLORIDE 0.9 % IV SOLN: 500.0000 mL | Freq: Once | INTRAVENOUS | Status: DC

## 2017-07-03 NOTE — Progress Notes (Signed)
Pt's states no medical or surgical changes since previsit or office visit. 

## 2017-07-03 NOTE — Progress Notes (Signed)
A and O x3. Report to RN. Tolerated MAC anesthesia well.

## 2017-07-03 NOTE — Patient Instructions (Signed)
YOU HAD AN ENDOSCOPIC PROCEDURE TODAY AT [facility]:   Refer to the procedure report that was given to you for any specific questions about what was found during the examination.  If the procedure report does not answer your questions, please call your gastroenterologist to clarify.  If you requested that your care partner not be given the details of your procedure findings, then the procedure report has been included in a sealed envelope for you to review at your convenience later.  YOU SHOULD EXPECT: Some feelings of bloating in the abdomen. Passage of more gas than usual.  Walking can help get rid of the air that was put into your GI tract during the procedure and reduce the bloating. If you had a lower endoscopy (such as a colonoscopy or flexible sigmoidoscopy) you may notice spotting of blood in your stool or on the toilet paper. If you underwent a bowel prep for your procedure, you may not have a normal bowel movement for a few days.  Please Note:  You might notice some irritation and congestion in your nose or some drainage.  This is from the oxygen used during your procedure.  There is no need for concern and it should clear up in a day or so.  SYMPTOMS TO REPORT IMMEDIATELY:   Following lower endoscopy (colonoscopy or flexible sigmoidoscopy):  Excessive amounts of blood in the stool  Significant tenderness or worsening of abdominal pains  Swelling of the abdomen that is new, acute  Fever of 100F or higher   For urgent or emergent issues, a gastroenterologist can be reached at any hour by calling [phone].   DIET:  We do recommend a small meal at first, but then you may proceed to your regular diet.  Drink plenty of fluids but you should avoid alcoholic beverages for 24 hours.  ACTIVITY:  You should plan to take it easy for the rest of today and you should NOT DRIVE or use heavy machinery until tomorrow (because of the sedation medicines used during the test).     FOLLOW UP: Our staff will call the number listed on your records the next business day following your procedure to check on you and address any questions or concerns that you may have regarding the information given to you following your procedure. If we do not reach you, we will leave a message.  However, if you are feeling well and you are not experiencing any problems, there is no need to return our call.  We will assume that you have returned to your regular daily activities without incident.  If any biopsies were taken you will be contacted by phone or by letter within the next 1-3 weeks.  Please call us at [phone] if you have not heard about the biopsies in 3 weeks.    SIGNATURES/CONFIDENTIALITY: You and/or your care partner have signed paperwork which will be entered into your electronic medical record.  These signatures attest to the fact that that the information above on your After Visit Summary has been reviewed and is understood.  Full responsibility of the confidentiality of this discharge information lies with you and/or your care-partner.  Recall 5 years.

## 2017-07-03 NOTE — Op Note (Signed)
[place] Patient Name: April [name] Procedure Date: 07/03/2017 8:30 AM MRN: [id number] Endoscopist: Ladene Artist , MD Age: 52 Referring MD:  Date of Birth: 1965/09/17 Gender: Female Account #: [id number] Procedure:                Colonoscopy Indications:              High risk colon cancer surveillance: Personal                            history of sessile serrated colon polyp (less than                            10 mm in size) with no dysplasia. Family history of                            colon cancer, father. Medicines:                Monitored Anesthesia Care Procedure:                Pre-Anesthesia Assessment:                           - Prior to the procedure, a History and Physical                            was performed, and patient medications and                            allergies were reviewed. The patient's tolerance of                            previous anesthesia was also reviewed. The risks                            and benefits of the procedure and the sedation                            options and risks were discussed with the patient.                            All questions were answered, and informed consent                            was obtained. Prior Anticoagulants: The patient has                            taken no previous anticoagulant or antiplatelet                            agents. ASA Grade Assessment: II - A patient with                            mild systemic disease. After reviewing the risks  and benefits, the patient was deemed in                            satisfactory condition to undergo the procedure.                           After obtaining informed consent, the colonoscope                            was passed under direct vision. Throughout the                            procedure, the patient's blood pressure, pulse, and                            oxygen saturations were monitored  continuously. The                            Colonoscope was introduced through the anus and                            advanced to the the cecum, identified by                            appendiceal orifice and ileocecal valve. The                            ileocecal valve, appendiceal orifice, and rectum                            were photographed. The quality of the bowel                            preparation was excellent. The colonoscopy was                            performed without difficulty. The patient tolerated                            the procedure well. Scope In: 8:35:02 AM Scope Out: 8:51:12 AM Scope Withdrawal Time: 0 hours 12 minutes 12 seconds  Total Procedure Duration: 0 hours 16 minutes 10 seconds  Findings:                 The perianal and digital rectal examinations were                            normal.                           Internal hemorrhoids were found during                            retroflexion. The hemorrhoids were small and Grade  I (internal hemorrhoids that do not prolapse).                           The exam was otherwise without abnormality on                            direct and retroflexion views. Complications:            No immediate complications. Estimated blood loss:                            None. Estimated Blood Loss:     Estimated blood loss: none. Impression:               - Internal hemorrhoids.                           - The examination was otherwise normal on direct                            and retroflexion views.                           - No specimens collected. Recommendation:           - Repeat colonoscopy in 5 years for surveillance.                           - Patient has a contact number available for                            emergencies. The signs and symptoms of potential                            delayed complications were discussed with the                            patient.  Return to normal activities tomorrow.                            Written discharge instructions were provided to the                            patient.                           - Resume previous diet.                           - Continue present medications. Ladene Artist, MD 07/03/2017 8:54:40 AM This report has been signed electronically.

## 2017-07-06 ENCOUNTER — Telehealth: Payer: Self-pay | Admitting: *Deleted

## 2017-07-06 ENCOUNTER — Telehealth: Payer: Self-pay

## 2017-07-06 NOTE — Telephone Encounter (Signed)
Called [phone] and left a messaged we tried to reach pt for a follow up call. maw

## 2017-07-06 NOTE — Telephone Encounter (Signed)
  Follow up Call-  Call back number 07/03/2017  Post procedure Call Back phone  # [phone]  Permission to leave phone message Yes  Some recent data might be hidden     Patient questions:  Do you have a fever, pain , or abdominal swelling? No. Pain Score  0 *  Have you tolerated food without any problems? Yes.    Have you been able to return to your normal activities? Yes.    Do you have any questions about your discharge instructions: Diet   No. Medications  No. Follow up visit  No.  Do you have questions or concerns about your Care? No.  Actions: * If pain score is 4 or above: No action needed, pain <4.

## 2017-10-16 ENCOUNTER — Other Ambulatory Visit: Payer: Self-pay | Admitting: Gynecology

## 2018-01-12 ENCOUNTER — Other Ambulatory Visit: Payer: Self-pay | Admitting: Dermatology

## 2018-03-10 ENCOUNTER — Other Ambulatory Visit: Payer: Self-pay | Admitting: Gynecology

## 2018-03-10 DIAGNOSIS — Z1231 Encounter for screening mammogram for malignant neoplasm of breast: Secondary | ICD-10-CM

## 2018-04-21 ENCOUNTER — Ambulatory Visit
Admission: RE | Admit: 2018-04-21 | Discharge: 2018-04-21 | Disposition: A | Discharge status: Home / Self Care | Payer: 59 | Source: Ambulatory Visit | Attending: Gynecology | Admitting: Gynecology

## 2018-04-21 DIAGNOSIS — Z1231 Encounter for screening mammogram for malignant neoplasm of breast: Secondary | ICD-10-CM

## 2018-05-19 ENCOUNTER — Encounter: Payer: Self-pay | Admitting: Gynecology

## 2018-05-19 ENCOUNTER — Ambulatory Visit (INDEPENDENT_AMBULATORY_CARE_PROVIDER_SITE_OTHER): Payer: BLUE CROSS/BLUE SHIELD | Admitting: Gynecology

## 2018-05-19 VITALS — BP 118/76 | Ht 69.0 in | Wt 164.0 lb

## 2018-05-19 DIAGNOSIS — Z01419 Encounter for gynecological examination (general) (routine) without abnormal findings: Secondary | ICD-10-CM

## 2018-05-19 DIAGNOSIS — Z309 Encounter for contraceptive management, unspecified: Secondary | ICD-10-CM | POA: Diagnosis not present

## 2018-05-19 DIAGNOSIS — R87610 Atypical squamous cells of undetermined significance on cytologic smear of cervix (ASC-US): Secondary | ICD-10-CM | POA: Diagnosis not present

## 2018-05-19 NOTE — Progress Notes (Signed)
    April [name] Jul 11, 1965 [id number]        53 y.o.  G0P0 for annual gynecologic exam.  Doing well without gynecologic complaints.  Past medical history,surgical history, problem list, medications, allergies, family history and social history were all reviewed and documented as reviewed in the EPIC chart.  ROS:  Performed with pertinent positives and negatives included in the history, assessment and plan.   Additional significant findings : None   Exam: April [name] assistant Vitals:   05/19/18 1526  BP: 118/76  Weight: 164 lb (74.4 kg)  Height: 5\' 9"  (1.753 m)   Body mass index is 24.22 kg/m.  General appearance:  Normal affect, orientation and appearance. Skin: Grossly normal HEENT: Without gross lesions.  No cervical or supraclavicular adenopathy. Thyroid normal.  Lungs:  Clear without wheezing, rales or rhonchi Cardiac: RR, without RMG Abdominal:  Soft, nontender, without masses, guarding, rebound, organomegaly or hernia Breasts:  Examined lying and sitting without masses, retractions, discharge or axillary adenopathy. Pelvic:  Ext, BUS, Vagina: Normal  Cervix: Normal  Uterus: Anteverted, normal size, shape and contour, midline and mobile nontender   Adnexa: Without masses or tenderness    Anus and perineum: Normal   Rectovaginal: Normal sphincter tone without palpated masses or tenderness.    Assessment/Plan:  53 y.o. G0P0 female for annual gynecologic exam without menses, continuous oral contraceptives, vasectomy birth control.   1. Lo Loestrin 1/10 menstrual suppression.  We again discussed the issues of continuing oral contraceptives and the risks to include possible increased risk of thrombosis such as stroke heart attack DVT.  I recommended the patient stop her birth control pills now and to check a follow-up April [name] in 1 month.  Future order placed.  If [name] elevated and asymptomatic then will monitor.  If April [name] elevated and patient develops symptoms of menopause  then will consider HRT.  If April [name] normal then patient wants to continue pills for another year accepting the thrombosis risk. 2. History LGSIL 2016.  Pap smears 2000 17/2018 ASCUS with positive high risk HPV.  Colposcopy 05/2016- with negative ECC.  Pap smear/HPV last year showed ASCUS negative high risk HPV.  Pap smear/HPV done today. 3. Mammography 04/2018.  Continue with annual mammography next year.  Breast exam normal today. 4. Colonoscopy 2019.  Repeat at their recommended interval. 5. Health maintenance.  No routine lab work done as patient does this elsewhere.  Follow-up for [name] and decision about oral contraceptives.  Follow-up in 1 year for annual exam.   April Auerbach MD, 4:15 PM 05/19/2018

## 2018-05-19 NOTE — Addendum Note (Signed)
Addended by: Nelva Nay on: 05/19/2018 04:21 PM   Modules accepted: Orders

## 2018-05-19 NOTE — Patient Instructions (Signed)
Follow-up for blood test in 1 month.  We will make a decision about the birth control pills at that time.  Follow-up for annual exam in 1 year.

## 2018-05-20 ENCOUNTER — Other Ambulatory Visit: Payer: Self-pay | Admitting: Gynecology

## 2018-05-20 ENCOUNTER — Encounter: Payer: Self-pay | Admitting: Gynecology

## 2018-05-20 LAB — PAP IG AND HPV HIGH-RISK: HPV DNA High Risk: NOT DETECTED

## 2018-05-20 MED ORDER — FLUCONAZOLE 150 MG PO TABS: 150.0000 mg | ORAL_TABLET | Freq: Once | ORAL | 0 refills | Status: AC

## 2018-05-20 NOTE — Progress Notes (Signed)
Diflucan ordered due to yeast on Pap

## 2018-06-04 NOTE — Telephone Encounter (Signed)
Spoke with patient and informed her. °

## 2018-06-17 ENCOUNTER — Other Ambulatory Visit: Payer: BLUE CROSS/BLUE SHIELD

## 2018-06-17 DIAGNOSIS — Z309 Encounter for contraceptive management, unspecified: Secondary | ICD-10-CM | POA: Diagnosis not present

## 2018-06-18 LAB — FOLLICLE STIMULATING HORMONE: FSH: 50.2 m[IU]/mL

## 2018-08-12 DIAGNOSIS — L659 Nonscarring hair loss, unspecified: Secondary | ICD-10-CM | POA: Diagnosis not present

## 2018-08-12 DIAGNOSIS — M2550 Pain in unspecified joint: Secondary | ICD-10-CM | POA: Diagnosis not present

## 2018-08-12 DIAGNOSIS — Z8639 Personal history of other endocrine, nutritional and metabolic disease: Secondary | ICD-10-CM | POA: Diagnosis not present

## 2018-08-12 DIAGNOSIS — E7800 Pure hypercholesterolemia, unspecified: Secondary | ICD-10-CM | POA: Diagnosis not present

## 2018-08-12 DIAGNOSIS — I10 Essential (primary) hypertension: Secondary | ICD-10-CM | POA: Diagnosis not present

## 2018-08-23 DIAGNOSIS — Z0000 Encounter for general adult medical examination without abnormal findings: Secondary | ICD-10-CM | POA: Diagnosis not present

## 2018-09-13 ENCOUNTER — Telehealth: Payer: Self-pay

## 2018-09-13 NOTE — Telephone Encounter (Signed)
I am assuming your note should read patient said she is having breakthrough bleeding every week to every other week instead of patient says she is not having breakthrough bleeding every week to every other week

## 2018-09-13 NOTE — Telephone Encounter (Signed)
Patient states you had her d/c the birth control pill back in December.    "Notes recorded by [name], MD on 06/18/2018 at 9:10 AM EST Tell patient her [facility] is elevated consistent with menopause. I would stay off of the birth control pills. I would use backup contraception for now and see what the next 3 months bring. Assuming no bleeding and no significant menopausal symptoms then will follow. If she develops significant menopausal symptoms we can discuss HRT. If she starts having irregular bleeding then we can discuss going back on the low-dose birth control pill for another year."  Patient said she is not having BTB every week to every other week for a day or two each time.  She would like to go back on the LoLoestin if okay with you.

## 2018-09-14 NOTE — Telephone Encounter (Signed)
There are several different options to manage this.  If she was not having this before stopping the birth control pills then one option would be to restart the low-dose birth control pills.  If the bleeding would continue then I would start with ultrasound evaluation but if she resumes regular menses with the birth control pills then I would stay on them for the remainder of the year and then try stopping them again.

## 2018-09-14 NOTE — Telephone Encounter (Signed)
Yes, sorry, it should read "she is NOW having BTB every week to every other week.Marland KitchenMarland Kitchen"

## 2018-09-14 NOTE — Telephone Encounter (Signed)
Patient called back to let you know that she had no BTB while taking OC previously. She would like to restart. Ok to send the [employer] as she requested?

## 2018-09-14 NOTE — Telephone Encounter (Signed)
Called her and per DPR access note on file I left a detailed message in voice mail reading her Dr. Dorette Grate note. I asked her to call me and confirm not having this BTB when she was on OC. Also asked her to leave me her pharmacy.

## 2018-09-15 ENCOUNTER — Other Ambulatory Visit: Payer: Self-pay | Admitting: Gynecology

## 2018-09-15 MED ORDER — NORETHIN-ETH ESTRAD-FE BIPHAS 1 MG-10 MCG / 10 MCG PO TABS: 1.0000 | ORAL_TABLET | Freq: Every day | ORAL | 2 refills | Status: DC

## 2018-09-15 NOTE — Telephone Encounter (Signed)
Rx sent with refills until CE due in January.

## 2018-09-15 NOTE — Telephone Encounter (Signed)
Yes

## 2019-02-02 ENCOUNTER — Encounter: Payer: Self-pay | Admitting: Gynecology

## 2019-02-11 ENCOUNTER — Other Ambulatory Visit: Payer: Self-pay | Admitting: *Deleted

## 2019-02-14 ENCOUNTER — Other Ambulatory Visit: Payer: Self-pay

## 2019-02-16 ENCOUNTER — Telehealth: Payer: Self-pay | Admitting: *Deleted

## 2019-02-16 NOTE — Telephone Encounter (Signed)
Patient called and left message on triage voicemail stating she lost a pack of her birth control pills asked if we have samples. I called back and received her voicemail, I explained we no longer carry samples and the pharmacy has refills and should be able to get a pack.

## 2019-05-23 ENCOUNTER — Encounter: Payer: BLUE CROSS/BLUE SHIELD | Admitting: Gynecology

## 2019-06-06 ENCOUNTER — Other Ambulatory Visit: Payer: Self-pay

## 2019-06-07 ENCOUNTER — Ambulatory Visit (INDEPENDENT_AMBULATORY_CARE_PROVIDER_SITE_OTHER): Payer: BC Managed Care – PPO | Admitting: Obstetrics and Gynecology

## 2019-06-07 ENCOUNTER — Encounter: Payer: Self-pay | Admitting: Obstetrics and Gynecology

## 2019-06-07 VITALS — BP 118/76 | Ht 68.0 in | Wt 177.0 lb

## 2019-06-07 DIAGNOSIS — N898 Other specified noninflammatory disorders of vagina: Secondary | ICD-10-CM

## 2019-06-07 DIAGNOSIS — N951 Menopausal and female climacteric states: Secondary | ICD-10-CM | POA: Diagnosis not present

## 2019-06-07 DIAGNOSIS — Z01419 Encounter for gynecological examination (general) (routine) without abnormal findings: Secondary | ICD-10-CM

## 2019-06-07 DIAGNOSIS — Z79890 Hormone replacement therapy (postmenopausal): Secondary | ICD-10-CM

## 2019-06-07 LAB — WET PREP FOR TRICH, YEAST, CLUE

## 2019-06-07 MED ORDER — ESTRADIOL-NORETHINDRONE ACET 1-0.5 MG PO TABS: 1.0000 | ORAL_TABLET | Freq: Every day | ORAL | 12 refills | Status: DC

## 2019-06-07 NOTE — Progress Notes (Signed)
April [name] 30-Mar-1966 [id number]  SUBJECTIVE:  54 y.o. G0P0 female for annual routine gynecologic exam.  She is experiencing significant hot flashes and night sweats.  This is interfering with her quality of life and ability to work during the day, and fortunately she is currently working from home which does allow herself time to take brief breaks during these episodes.  She does remain on Lo Loestrin as she did try to discontinue this previously but had experienced irregular bleeding so she went back on it with good regulation of her menstrual bleeding.  FSH was checked last year and was in the menopausal range.  She has been concerned about increased string-like vaginal discharge.   Current Outpatient Medications  Medication Sig Dispense Refill  . acetaminophen (TYLENOL) 500 MG tablet Take 500 mg by mouth every 6 (six) hours as needed. For pain    . calcium-vitamin D (OSCAL WITH D) 500-200 MG-UNIT per tablet Take 1 tablet by mouth daily.    Marland Kitchen loratadine (CLARITIN) 10 MG tablet Take 10 mg by mouth daily.     . Multiple Vitamin (MULTIVITAMIN) capsule Take 1 capsule by mouth daily.     . Norethindrone-Ethinyl Estradiol-Fe Biphas (LO LOESTRIN FE) 1 MG-10 MCG / 10 MCG tablet Take 1 tablet by mouth daily. 84 tablet 2   Current Facility-Administered Medications  Medication Dose Route Frequency Provider Last Rate Last Admin  . 0.9 %  sodium chloride infusion  500 mL Intravenous Once Ladene Artist, MD       Allergies: Amoxicillin and Penicillins  No LMP recorded. (Menstrual status: Oral contraceptives).  Past medical history,surgical history, problem list, medications, allergies, family history and social history were all reviewed and documented as reviewed in the EPIC chart.  ROS:  Feeling well. No dyspnea or chest pain on exertion.  No abdominal pain, change in bowel habits, black or bloody stools.  No urinary tract symptoms. GYN ROS: normal menses (on OCPs), no abnormal bleeding,  pelvic pain or discharge, no breast pain or new or enlarging lumps on self exam. +hot flashes +night sweats.  No neurological complaints.    OBJECTIVE:  BP 118/76   Ht 5\' 8"  (1.727 m)   Wt 177 lb (80.3 kg)   BMI 26.91 kg/m  The patient appears well, alert, oriented x 3, in no distress. ENT normal.  Neck supple. No cervical or supraclavicular adenopathy or thyromegaly.  Lungs are clear, good air entry, no wheezes, rhonchi or rales. S1 and S2 normal, no murmurs, regular rate and rhythm.  Abdomen soft without tenderness, guarding, mass or organomegaly.  Neurological is normal, no focal findings.  BREAST EXAM: breasts appear normal, no suspicious masses, no skin or nipple changes or axillary nodes  PELVIC EXAM: VULVA: normal appearing vulva with no masses, tenderness or lesions, VAGINA: normal appearing vagina with normal color, increased thick yellow to white stringy discharge is present, no odor, no bleeding, no lesions, CERVIX: normal appearing cervix without discharge or lesions, UTERUS: uterus is normal size, shape, consistency and nontender, ADNEXA: normal adnexa in size, nontender and no masses, WET MOUNT done - results: negative for pathogens, normal epithelial cells  Chaperone: [name] present during the examination  ASSESSMENT:  54 y.o. G0P0 here for annual gynecologic exam  PLAN:   1. Postmenopausal. Experiencing significant vasomotor symptoms.  HRT and nonhormonal options including SSRIs and behavioral modifications are all discussed.  She does not have any contraindication to using HRT.  We discussed the importance of use of progestins  to complement the use of estrogens in women that still have a uterus to decrease likelihood of development of endometrial hyperplasia and cancer.  Risks of estrogen use includes increased risk of thrombotic disease, such as heart attack, stroke, DVT, PE.  Estrogen use and progestin use can slightly increase risk of breast cancer.  We discussed  discontinuation of the oral contraceptive pill if she would like to start HRT.  I did give her some credible resources to check out on the Internet from ACOG and NAMS.  I did prescribe her Activella 0.5/1.  I discussed the length of time that she should monitor for her symptoms before expecting improvement and we discussed that the lowest effective dose of estrogen is safest.  If no improvement, she should let us know.  She will discontinue the oral contraceptive pills.  She understands she may experience some withdrawal bleeding. 2. Pap smear/HPV 05/2018 normal. Not repeated today. History of abnormal Pap smears, including LGSIL 2016, ASCUS with positive high-risk HPV in 2018, colposcopy in 2018 indicated negative findings and negative ECC, Pap smear 2019 showed ASCUS with negative high-risk HPV. Pap smear last year in 2020 was normal.  Next Pap smear due 2025 following the current screening guidelines calling for the 5-year interval. 3. Vaginal discharge. Presumed physiologic due to perimenopausal and early postmenopausal hormone changes.  Wet prep was negative today. 4. Mammogram 04/2018. Will continue with annual mammography. Breast exam normal today. 5. Colonoscopy 2019. Recommended that she continue per the prescribed interval.  6 Health maintenance.  No lab work as she has this completed with her primary care provider.   Return annually or sooner, prn.  15 minutes were spent in discussion of the risks and benefits of hormone replacement therapy.   Larey Days MD  06/07/19

## 2019-06-07 NOTE — Patient Instructions (Addendum)
It was nice to meet you today! You may stop taking the birth control pill and start on the hormone replacement therapy Activella when you are ready to do so.  You may experience some withdrawal vaginal bleeding after stopping the birth control pill.  Please give the Activella at least a few weeks to work and by about a month of the therapy you should be seeing benefit from it.  If you are still experiencing significant symptoms, we could consider increasing the estrogen dose.\ The vaginal swab test from today was negative and did not indicate yeast or bacterial imbalance. Please remember to schedule your annual mammogram this year. You can also check out information from ACOG and the NAMS-[employer] American Menopause Society websites.   Menopause and Hormone Replacement Therapy Menopause is a normal time of life when menstrual periods stop completely and the ovaries stop producing the female hormones estrogen and progesterone. This lack of hormones can affect your health and cause undesirable symptoms. Hormone replacement therapy (HRT) can relieve some of those symptoms. What is hormone replacement therapy? HRT is the use of artificial (synthetic) hormones to replace hormones that your body has stopped producing because you have reached menopause. What are my options for HRT?  HRT may consist of the synthetic hormones estrogen and progestin, or it may consist of only estrogen (estrogen-only therapy). You and your health care provider will decide which form of HRT is best for you. If you choose to be on HRT and you have a uterus, estrogen and progestin are usually prescribed. Estrogen-only therapy is used for women who do not have a uterus. Possible options for taking HRT include:  Pills.  Patches.  Gels.  Sprays.  Vaginal cream.  Vaginal rings.  Vaginal inserts. The amount of hormone(s) that you take and how long you take the hormone(s) varies according to your health. It is important  to:  Begin HRT with the lowest possible dosage.  Stop HRT as soon as your health care provider tells you to stop.  Work with your health care provider so that you feel informed and comfortable with your decisions. What are the benefits of HRT? HRT can reduce the frequency and severity of menopausal symptoms. Benefits of HRT vary according to the kind of symptoms that you have, how severe they are, and your overall health. HRT may help to improve the following symptoms of menopause:  Hot flashes and night sweats. These are sudden feelings of heat that spread over the face and body. The skin may turn red, like a blush. Night sweats are hot flashes that happen while you are sleeping or trying to sleep.  Bone loss (osteoporosis). The body loses calcium more quickly after menopause, causing the bones to become weaker. This can increase the risk for bone breaks (fractures).  Vaginal dryness. The lining of the vagina can become thin and dry, which can cause pain during sex or cause infection, burning, or itching.  Urinary tract infections.  Urinary incontinence. This is the inability to control when you pass urine.  Irritability.  Short-term memory problems. What are the risks of HRT? Risks of HRT vary depending on your individual health and medical history. Risks of HRT also depend on whether you receive both estrogen and progestin or you receive estrogen only. HRT may increase the risk of:  Spotting. This is when a small amount of blood leaks from the vagina unexpectedly.  Endometrial cancer. This cancer is in the lining of the uterus (endometrium).  Breast cancer.  Increased density of breast tissue. This can make it harder to find breast cancer on a breast X-ray (mammogram).  Stroke.  Heart disease.  Blood clots.  Gallbladder disease.  Liver disease. Risks of HRT can increase if you have any of the following conditions:  Endometrial cancer.  Liver disease.  Heart  disease.  Breast cancer.  History of blood clots.  History of stroke. Follow these instructions at home:  Take over-the-counter and prescription medicines only as told by your health care provider.  Get mammograms, pelvic exams, and medical checkups as often as told by your health care provider.  Have Pap tests done as often as told by your health care provider. A Pap test is sometimes called a Pap smear. It is a screening test that is used to check for signs of cancer of the cervix and vagina. A Pap test can also identify the presence of infection or precancerous changes. Pap tests may be done: ? Every 3 years, starting at age 24. ? Every 5 years, starting after age 101, in combination with testing for human papillomavirus (HPV). ? More often or less often depending on other medical conditions you have, your age, and other risk factors.  It is up to you to get the results of your Pap test. Ask your health care provider, or the department that is doing the test, when your results will be ready.  Keep all follow-up visits as told by your health care provider. This is important. Contact a health care provider if you have:  Pain or swelling in your legs.  Shortness of breath.  Chest pain.  Lumps or changes in your breasts or armpits.  Slurred speech.  Pain, burning, or bleeding when you urinate.  Unusual vaginal bleeding.  Dizziness or headaches.  Weakness or numbness in any part of your arms or legs.  Pain in your abdomen. Summary  Menopause is a normal time of life when menstrual periods stop completely and the ovaries stop producing the female hormones estrogen and progesterone.  Hormone replacement therapy (HRT) can relieve some of the symptoms of menopause.  HRT can reduce the frequency and severity of menopausal symptoms.  Risks of HRT vary depending on your individual health and medical history. This information is not intended to replace advice given to you by  your health care provider. Make sure you discuss any questions you have with your health care provider. Document Revised: 12/29/2017 Document Reviewed: 12/29/2017 Elsevier Patient Education  2020 [employer].

## 2019-06-09 NOTE — Telephone Encounter (Signed)
Spoke with patient and informed her. °

## 2019-07-27 ENCOUNTER — Other Ambulatory Visit: Payer: Self-pay

## 2019-08-04 ENCOUNTER — Other Ambulatory Visit: Payer: Self-pay | Admitting: Obstetrics and Gynecology

## 2019-08-04 DIAGNOSIS — Z1231 Encounter for screening mammogram for malignant neoplasm of breast: Secondary | ICD-10-CM

## 2019-08-13 ENCOUNTER — Ambulatory Visit: Payer: Self-pay | Attending: Internal Medicine

## 2019-08-13 DIAGNOSIS — Z23 Encounter for immunization: Secondary | ICD-10-CM

## 2019-08-13 NOTE — Progress Notes (Signed)
   Covid-19 Vaccination Clinic  Name:  April [name]    MRN: [id number] DOB: [birth year]  08/13/2019  April [name] was observed post Covid-19 immunization for 15 minutes without incident. She was provided with Vaccine Information Sheet and instruction to access the V-Safe system.   April [name] was instructed to call 911 with any severe reactions post vaccine: Marland Kitchen Difficulty breathing  . Swelling of face and throat  . A fast heartbeat  . A bad rash all over body  . Dizziness and weakness   Immunizations Administered    Name Date Dose VIS Date Route   Pfizer COVID-19 Vaccine 08/13/2019 12:14 PM 0.3 mL 04/22/2019 Intramuscular   Manufacturer: [place]   Lot: [id number]   [place]: [id number]

## 2019-09-06 ENCOUNTER — Ambulatory Visit: Payer: Self-pay | Attending: Internal Medicine

## 2019-09-06 DIAGNOSIS — Z23 Encounter for immunization: Secondary | ICD-10-CM

## 2019-09-06 NOTE — Progress Notes (Signed)
   Covid-19 Vaccination Clinic  Name:  April [name]    MRN: [id number] DOB: 1965/09/05  09/06/2019  [name] was observed post Covid-19 immunization for 15 minutes without incident. She was provided with Vaccine Information Sheet and instruction to access the V-Safe system.   [name] was instructed to call 911 with any severe reactions post vaccine: Marland Kitchen Difficulty breathing  . Swelling of face and throat  . A fast heartbeat  . A bad rash all over body  . Dizziness and weakness   Immunizations Administered    Name Date Dose VIS Date Route   Pfizer COVID-19 Vaccine 09/06/2019  4:12 PM 0.3 mL 07/06/2018 Intramuscular   Manufacturer: [place]   Lot: [id number]   [place]: [id number]

## 2019-10-04 DIAGNOSIS — Z8639 Personal history of other endocrine, nutritional and metabolic disease: Secondary | ICD-10-CM | POA: Diagnosis not present

## 2019-10-04 DIAGNOSIS — E7800 Pure hypercholesterolemia, unspecified: Secondary | ICD-10-CM | POA: Diagnosis not present

## 2019-10-04 DIAGNOSIS — Z0000 Encounter for general adult medical examination without abnormal findings: Secondary | ICD-10-CM | POA: Diagnosis not present

## 2019-10-11 DIAGNOSIS — R635 Abnormal weight gain: Secondary | ICD-10-CM | POA: Diagnosis not present

## 2019-10-11 DIAGNOSIS — N951 Menopausal and female climacteric states: Secondary | ICD-10-CM | POA: Diagnosis not present

## 2019-10-11 DIAGNOSIS — Z0000 Encounter for general adult medical examination without abnormal findings: Secondary | ICD-10-CM | POA: Diagnosis not present

## 2019-11-01 ENCOUNTER — Ambulatory Visit
Admission: RE | Admit: 2019-11-01 | Discharge: 2019-11-01 | Disposition: A | Discharge status: Home / Self Care | Payer: BC Managed Care – PPO | Source: Ambulatory Visit | Attending: Obstetrics and Gynecology | Admitting: Obstetrics and Gynecology

## 2019-11-01 ENCOUNTER — Other Ambulatory Visit: Payer: Self-pay

## 2019-11-01 DIAGNOSIS — Z1231 Encounter for screening mammogram for malignant neoplasm of breast: Secondary | ICD-10-CM

## 2020-06-07 ENCOUNTER — Encounter: Payer: Self-pay | Admitting: Obstetrics and Gynecology

## 2020-06-07 ENCOUNTER — Other Ambulatory Visit: Payer: Self-pay

## 2020-06-07 ENCOUNTER — Ambulatory Visit (INDEPENDENT_AMBULATORY_CARE_PROVIDER_SITE_OTHER): Payer: BC Managed Care – PPO | Admitting: Obstetrics and Gynecology

## 2020-06-07 VITALS — Ht 68.0 in | Wt 208.0 lb

## 2020-06-07 DIAGNOSIS — Z01419 Encounter for gynecological examination (general) (routine) without abnormal findings: Secondary | ICD-10-CM

## 2020-06-07 DIAGNOSIS — N951 Menopausal and female climacteric states: Secondary | ICD-10-CM | POA: Diagnosis not present

## 2020-06-07 NOTE — Progress Notes (Signed)
   Miyo Aina [birth year] [id number]  SUBJECTIVE:  55 y.o. G0P0 female for annual routine breast and pelvic gynecologic exam.  She ended up not taking HRT that was prescribed after last year's appointment.  Has bearing with hot flashes and night sweats with mood changes.   Current Outpatient Medications  Medication Sig Dispense Refill  . calcium-vitamin D (OSCAL WITH D) 500-200 MG-UNIT per tablet Take 1 tablet by mouth daily.    Marland Kitchen loratadine (CLARITIN) 10 MG tablet Take 10 mg by mouth daily.    . Multiple Vitamin (MULTIVITAMIN) capsule Take 1 capsule by mouth daily.    Marland Kitchen acetaminophen (TYLENOL) 500 MG tablet Take 500 mg by mouth every 6 (six) hours as needed. For pain (Patient not taking: Reported on 06/07/2020)     Current Facility-Administered Medications  Medication Dose Route Frequency Provider Last Rate Last Admin  . 0.9 %  sodium chloride infusion  500 mL Intravenous Once Ladene Artist, MD       Allergies: Amoxicillin and Penicillins  No LMP recorded. (Menstrual status: Oral contraceptives).  Past medical history,surgical history, problem list, medications, allergies, family history and social history were all reviewed and documented as reviewed in the EPIC chart.  ROS: Pertinent positives and negatives as reviewed in HPI   OBJECTIVE:  Ht 5\' 8"  (1.727 m)   Wt 208 lb (94.3 kg)   BMI 31.63 kg/m  The patient appears well, alert, oriented x 3, in no distress.  BREAST EXAM: breasts appear normal, no suspicious masses, no skin or nipple changes or axillary nodes  PELVIC EXAM: VULVA: normal appearing vulva with no masses, tenderness or lesions, VAGINA: normal appearing vagina with normal color, normal discharge, no lesions, CERVIX: normal appearing cervix without discharge or lesions, UTERUS: uterus is normal size, shape, consistency and nontender, ADNEXA: normal adnexa in size, nontender and no masses  Chaperone: Lamont Snowball RN present during the  examination  ASSESSMENT:  55 y.o. G0P0 here for annual breast and pelvic gynecologic exam  PLAN:   1. Postmenopausal. Experiencing significant vasomotor symptoms.  HRT and nonhormonal options including SSRIs and behavioral modifications are all discussed.  She decided against using the HRT.  We discussed other methods including OTC supplements (she is already using black cohosh), also recommended trying meditation and regular physical activity which may help with some of the symptoms as well.  If she decides that she would like to try HRT she can follow-up with [place] or she has any other difficulties or concerns.   2. Pap smear/HPV 05/2018 normal. Not repeated today. History of abnormal Pap smears, including LGSIL 2016, ASCUS with positive high-risk HPV in 2018, colposcopy in 2018 indicated negative findings and negative ECC, Pap smear 2019 showed ASCUS with negative high-risk HPV. Pap smear last year in 2020 was normal.  Next Pap smear due 2025 following the current screening guidelines calling for the 5-year interval. 3. Mammogram 10/2019. Will continue with annual mammography. Breast exam normal today. 4. Colonoscopy 2019. Recommended that she continue per the prescribed interval.  5. Health maintenance.  No lab work as she has this completed with her primary care provider. The patient is aware that I will only be at this practice until early March 2022 so she knows to make sure she requests follow-up on results for any medical tests that she does when I am no longer at the practice.   Return annually or sooner, prn.    Joseph Pierini MD  06/07/20

## 2020-08-19 IMAGING — MG DIGITAL SCREENING BILATERAL MAMMOGRAM WITH TOMO AND CAD
8 series · 8 of 24 positions shown · non-contrast
Comparison: Previous exam(s).

CLINICAL DATA: Screening.

EXAM:
DIGITAL SCREENING BILATERAL MAMMOGRAM WITH TOMO AND CAD

[L MLO synth-2D]
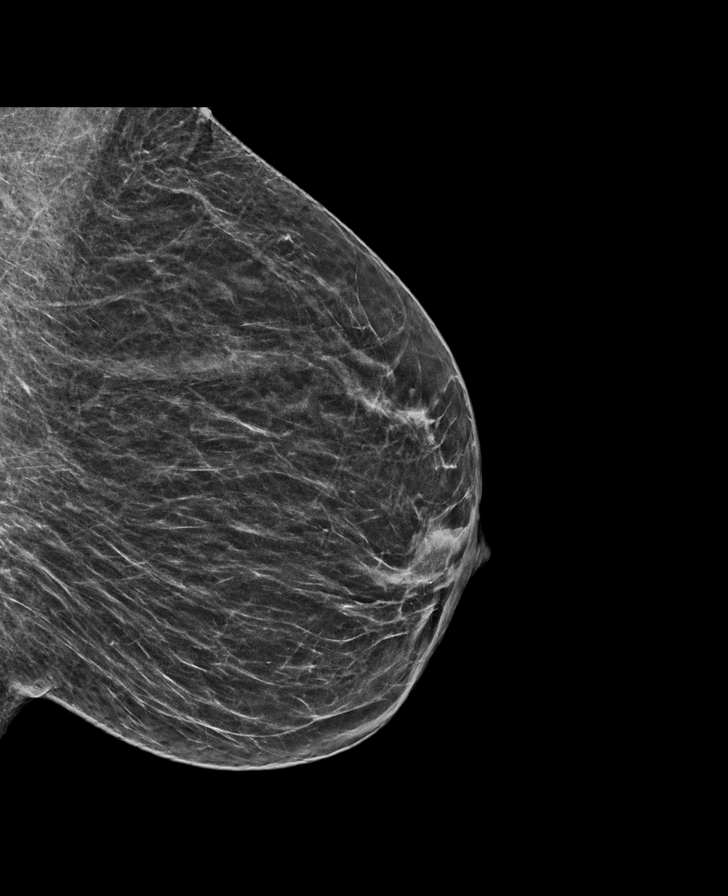

[R CC synth-2D]
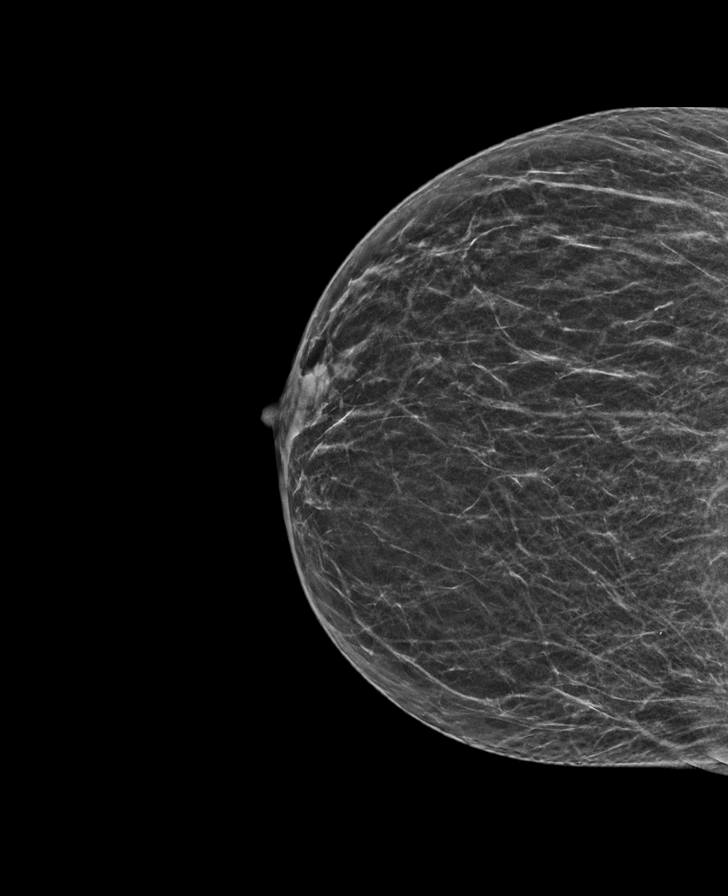

[R MLO synth-2D]
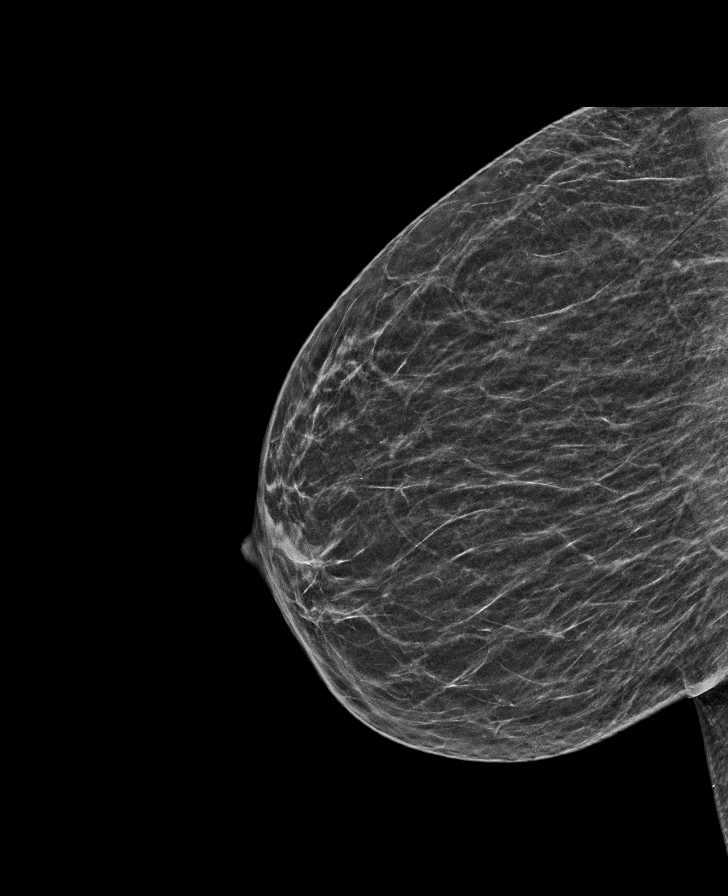

[L CC synth-2D]
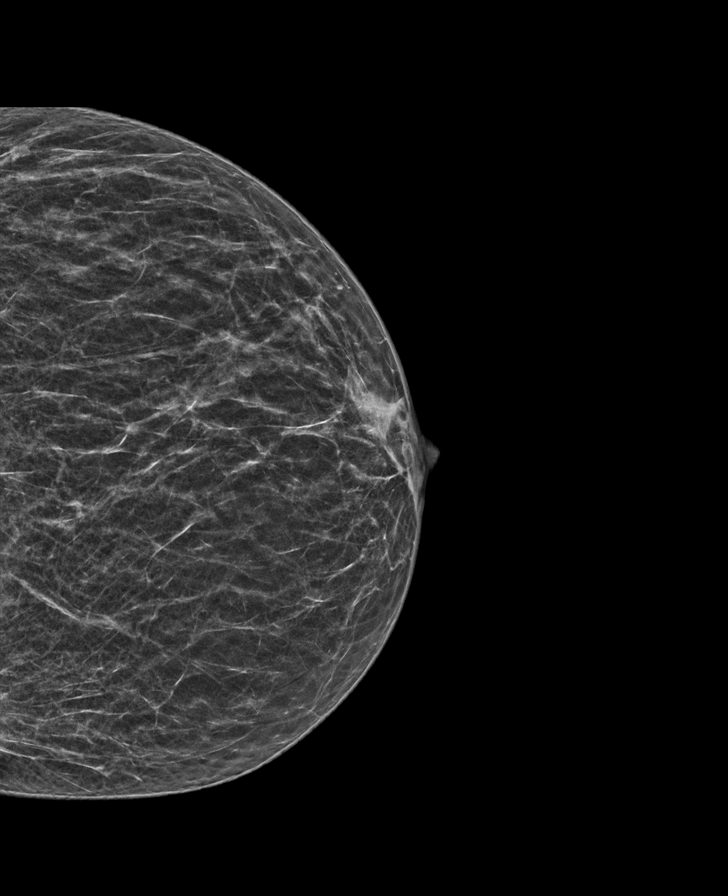

[R CC tomo · tomo slice 23/45.0]
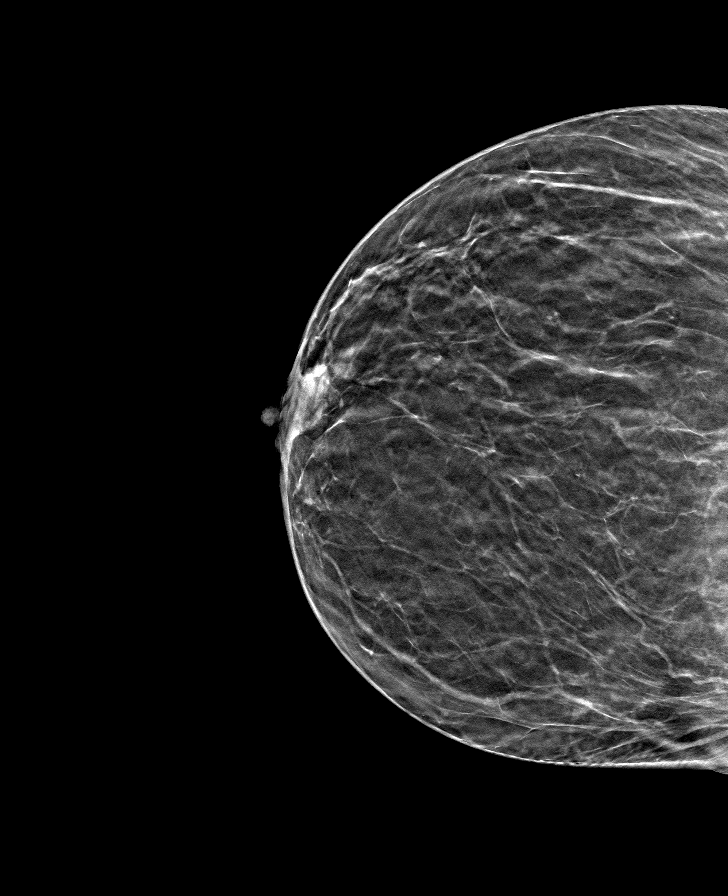

[L MLO tomo · tomo slice 27/53.0]
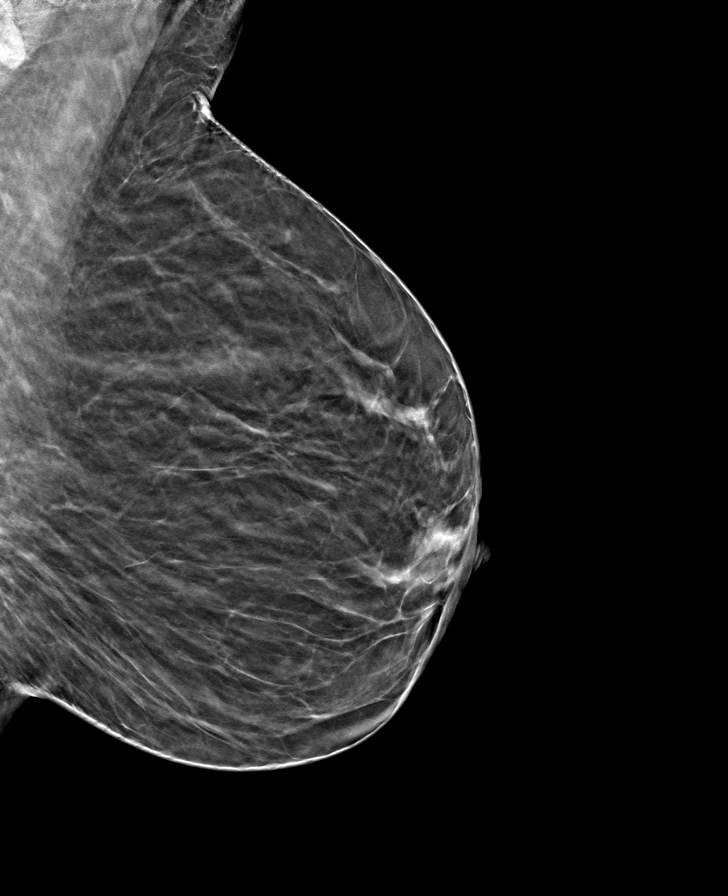

[L CC tomo · tomo slice 24/47.0]
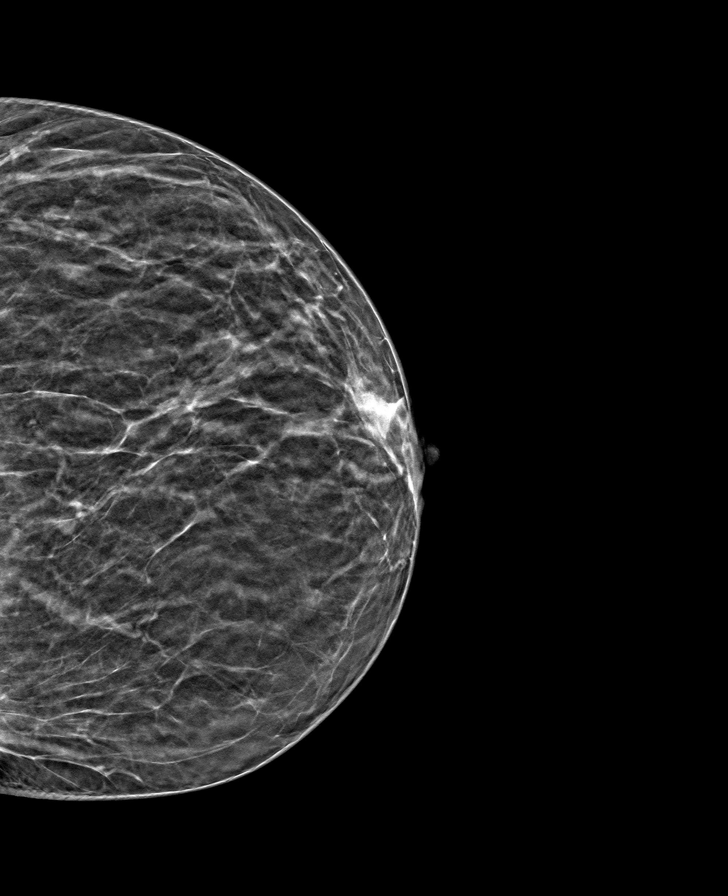

[R MLO tomo · tomo slice 23/45.0]
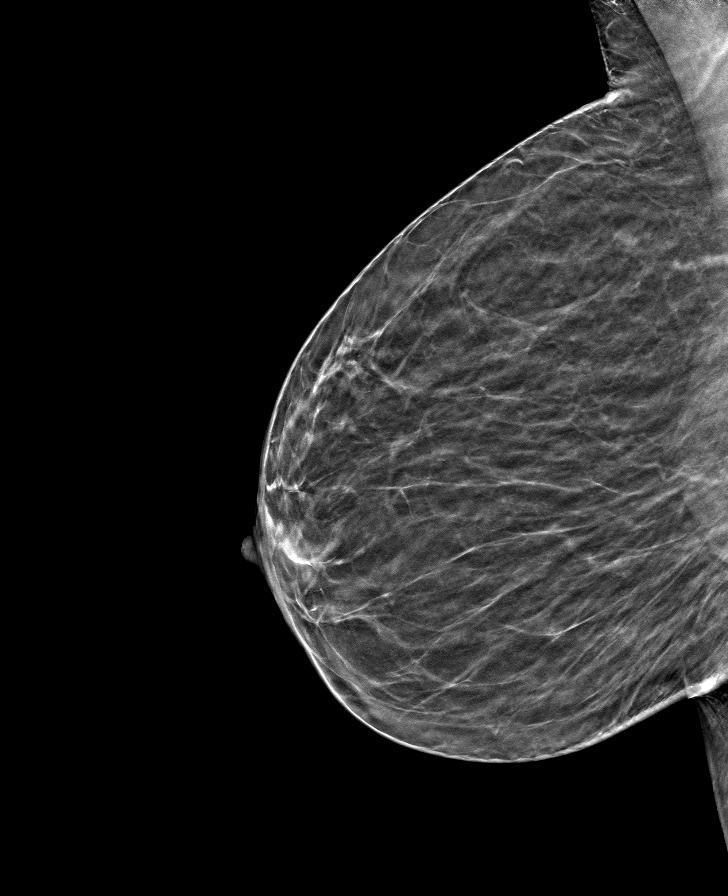

[8 of 24 positions shown; findings below may reference images not displayed]

ACR Breast Density Category b: There are scattered areas of
fibroglandular density.
FINDINGS: There are no findings suspicious for malignancy. Images were
processed with CAD.
IMPRESSION: No mammographic evidence of malignancy. A result letter of this
screening mammogram will be mailed directly to the patient.

RECOMMENDATION:
Screening mammogram in one year. (Code:[id number])

BI-RADS CATEGORY  1: Negative.

## 2020-10-02 ENCOUNTER — Other Ambulatory Visit: Payer: Self-pay | Admitting: Obstetrics & Gynecology

## 2020-10-02 DIAGNOSIS — Z1231 Encounter for screening mammogram for malignant neoplasm of breast: Secondary | ICD-10-CM

## 2020-10-29 DIAGNOSIS — Z20822 Contact with and (suspected) exposure to covid-19: Secondary | ICD-10-CM | POA: Diagnosis not present

## 2020-11-29 ENCOUNTER — Ambulatory Visit
Admission: RE | Admit: 2020-11-29 | Discharge: 2020-11-29 | Disposition: A | Discharge status: Home / Self Care | Payer: BC Managed Care – PPO | Source: Ambulatory Visit | Attending: Obstetrics & Gynecology | Admitting: Obstetrics & Gynecology

## 2020-11-29 ENCOUNTER — Other Ambulatory Visit: Payer: Self-pay

## 2020-11-29 DIAGNOSIS — Z1231 Encounter for screening mammogram for malignant neoplasm of breast: Secondary | ICD-10-CM | POA: Diagnosis not present

## 2021-05-15 DIAGNOSIS — E559 Vitamin D deficiency, unspecified: Secondary | ICD-10-CM | POA: Diagnosis not present

## 2021-05-15 DIAGNOSIS — R7309 Other abnormal glucose: Secondary | ICD-10-CM | POA: Diagnosis not present

## 2021-05-15 DIAGNOSIS — R7989 Other specified abnormal findings of blood chemistry: Secondary | ICD-10-CM | POA: Diagnosis not present

## 2021-05-15 DIAGNOSIS — E7801 Familial hypercholesterolemia: Secondary | ICD-10-CM | POA: Diagnosis not present

## 2021-05-15 DIAGNOSIS — I10 Essential (primary) hypertension: Secondary | ICD-10-CM | POA: Diagnosis not present

## 2021-05-28 DIAGNOSIS — D2272 Melanocytic nevi of left lower limb, including hip: Secondary | ICD-10-CM | POA: Diagnosis not present

## 2021-05-28 DIAGNOSIS — L570 Actinic keratosis: Secondary | ICD-10-CM | POA: Diagnosis not present

## 2021-05-28 DIAGNOSIS — L304 Erythema intertrigo: Secondary | ICD-10-CM | POA: Diagnosis not present

## 2021-05-28 DIAGNOSIS — L578 Other skin changes due to chronic exposure to nonionizing radiation: Secondary | ICD-10-CM | POA: Diagnosis not present

## 2021-05-28 DIAGNOSIS — D225 Melanocytic nevi of trunk: Secondary | ICD-10-CM | POA: Diagnosis not present

## 2021-06-11 ENCOUNTER — Other Ambulatory Visit: Payer: Self-pay

## 2021-06-11 ENCOUNTER — Ambulatory Visit (INDEPENDENT_AMBULATORY_CARE_PROVIDER_SITE_OTHER): Payer: BC Managed Care – PPO | Admitting: Obstetrics & Gynecology

## 2021-06-11 ENCOUNTER — Other Ambulatory Visit (HOSPITAL_COMMUNITY)
Admission: RE | Admit: 2021-06-11 | Discharge: 2021-06-11 | Disposition: A | Discharge status: Home / Self Care | Payer: BC Managed Care – PPO | Source: Ambulatory Visit | Attending: Obstetrics & Gynecology | Admitting: Obstetrics & Gynecology

## 2021-06-11 ENCOUNTER — Encounter: Payer: Self-pay | Admitting: Obstetrics & Gynecology

## 2021-06-11 VITALS — BP 120/78 | HR 80 | Resp 16 | Ht 67.5 in | Wt 217.0 lb

## 2021-06-11 DIAGNOSIS — N951 Menopausal and female climacteric states: Secondary | ICD-10-CM

## 2021-06-11 DIAGNOSIS — Z01419 Encounter for gynecological examination (general) (routine) without abnormal findings: Secondary | ICD-10-CM

## 2021-06-11 DIAGNOSIS — E6609 Other obesity due to excess calories: Secondary | ICD-10-CM

## 2021-06-11 DIAGNOSIS — Z6833 Body mass index (BMI) 33.0-33.9, adult: Secondary | ICD-10-CM

## 2021-06-11 MED ORDER — ESTRADIOL 0.5 MG PO TABS: 0.5000 mg | ORAL_TABLET | Freq: Every day | ORAL | 4 refills | Status: DC

## 2021-06-11 MED ORDER — PROGESTERONE MICRONIZED 100 MG PO CAPS: 100.0000 mg | ORAL_CAPSULE | Freq: Every day | ORAL | 4 refills | Status: DC

## 2021-06-11 NOTE — Progress Notes (Signed)
April [name] 09-23-1965 [id number]   History:    56 y.o.  G0  Long term boyfriend  RP:  Established patient presenting for annual gyn exam   HPI:  Postmenopausal with some improvement on vasomotor symptoms with Black Cohash.  C/O increased stress and foggy mind.  Has a lot of stress at work.  C/O weight gain.  No PMB.  No pelvic pain. Pap smear/HPV 05/2018 normal.  History of abnormal Pap smears, including LGSIL 2016, ASCUS with positive high-risk HPV in 2018, colposcopy no dysplasia in 2018.  BMI 33.49.  Breasts normal.  Mammogram Neg 11/2020. Colonoscopy 06/2017. Health labs with Fam MD.  Past medical history,surgical history, family history and social history were all reviewed and documented in the EPIC chart.  Gynecologic History Patient's last menstrual period was 04/25/2015.  Obstetric History OB History  Gravida Para Term Preterm AB Living  0            SAB IAB Ectopic Multiple Live Births                ROS: A ROS was performed and pertinent positives and negatives are included in the history.  GENERAL: No fevers or chills. HEENT: No change in vision, no earache, sore throat or sinus congestion. NECK: No pain or stiffness. CARDIOVASCULAR: No chest pain or pressure. No palpitations. PULMONARY: No shortness of breath, cough or wheeze. GASTROINTESTINAL: No abdominal pain, nausea, vomiting or diarrhea, melena or bright red blood per rectum. GENITOURINARY: No urinary frequency, urgency, hesitancy or dysuria. MUSCULOSKELETAL: No joint or muscle pain, no back pain, no recent trauma. DERMATOLOGIC: No rash, no itching, no lesions. ENDOCRINE: No polyuria, polydipsia, no heat or cold intolerance. No recent change in weight. HEMATOLOGICAL: No anemia or easy bruising or bleeding. NEUROLOGIC: No headache, seizures, numbness, tingling or weakness. PSYCHIATRIC: No depression, no loss of interest in normal activity or change in sleep pattern.     Exam:   BP 120/78    Pulse 80    Resp  16    Ht 5' 7.5" (1.715 m)    Wt 217 lb (98.4 kg)    LMP 04/25/2015    BMI 33.49 kg/m   Body mass index is 33.49 kg/m.  General appearance : Well developed well nourished female. No acute distress HEENT: Eyes: no retinal hemorrhage or exudates,  Neck supple, trachea midline, no carotid bruits, no thyroidmegaly Lungs: Clear to auscultation, no rhonchi or wheezes, or rib retractions  Heart: Regular rate and rhythm, no murmurs or gallops Breast:Examined in sitting and supine position were symmetrical in appearance, no palpable masses or tenderness,  no skin retraction, no nipple inversion, no nipple discharge, no skin discoloration, no axillary or supraclavicular lymphadenopathy Abdomen: no palpable masses or tenderness, no rebound or guarding Extremities: no edema or skin discoloration or tenderness  Pelvic: Vulva: Normal             Vagina: No gross lesions or discharge  Cervix: No gross lesions or discharge.  Pap reflex done.  Uterus  AV, normal size, shape and consistency, non-tender and mobile  Adnexa  Without masses or tenderness  Anus: Normal   Assessment/Plan:  56 y.o. female for annual exam   1. Encounter for routine gynecological examination with Papanicolaou smear of cervix Postmenopausal with some improvement on vasomotor symptoms with Black Cohash.  C/O increased stress and foggy mind.  Has a lot of stress at work.  C/O weight gain.  No PMB.  No pelvic pain. Pap smear/HPV  05/2018 normal.  History of abnormal Pap smears, including LGSIL 2016, ASCUS with positive high-risk HPV in 2018, colposcopy no dysplasia in 2018.  BMI 33.49.  Breasts normal.  Mammogram Neg 11/2020. Colonoscopy 06/2017. Health labs with Fam MD. - Cytology - PAP( )  2. Menopausal syndrome Postmenopausal with some improvement on vasomotor symptoms with Black Cohash.  C/O increased stress and foggy mind.  Has a lot of stress at work.  C/O weight gain.  No PMB.  No pelvic pain.  Counseling on management  of menopausal syndrome.  No CI to HRT.  Decision to start on Estradiol 0.05 mg PO daily and Progesterone 100 mg PO HS.  Usage reviewed, precautions discussed. Will stop [employer].  3. Class 1 obesity due to excess calories without serious comorbidity with body mass index (BMI) of 33.0 to 33.9 in adult Lower calorie/carb diet.  Increase fitness activities.  Other orders - TURMERIC PO - UNABLE TO FIND; Med Name: brain health vitamin - BLACK COHOSH PO; Take by mouth. - estradiol (ESTRACE) 0.5 MG tablet; Take 1 tablet (0.5 mg total) by mouth daily. - progesterone (PROMETRIUM) 100 MG capsule; Take 1 capsule (100 mg total) by mouth at bedtime.   Princess Bruins MD, 3:55 PM 06/11/2021

## 2021-06-13 LAB — CYTOLOGY - PAP: Diagnosis: NEGATIVE

## 2021-06-20 DIAGNOSIS — Z780 Asymptomatic menopausal state: Secondary | ICD-10-CM | POA: Diagnosis not present

## 2021-06-20 DIAGNOSIS — R7309 Other abnormal glucose: Secondary | ICD-10-CM | POA: Diagnosis not present

## 2021-06-20 DIAGNOSIS — Z0000 Encounter for general adult medical examination without abnormal findings: Secondary | ICD-10-CM | POA: Diagnosis not present

## 2021-06-20 DIAGNOSIS — E559 Vitamin D deficiency, unspecified: Secondary | ICD-10-CM | POA: Diagnosis not present

## 2021-06-20 DIAGNOSIS — Z23 Encounter for immunization: Secondary | ICD-10-CM | POA: Diagnosis not present

## 2021-06-20 DIAGNOSIS — R7989 Other specified abnormal findings of blood chemistry: Secondary | ICD-10-CM | POA: Diagnosis not present

## 2022-03-18 ENCOUNTER — Encounter: Payer: Self-pay | Admitting: Podiatry

## 2022-03-18 ENCOUNTER — Ambulatory Visit (INDEPENDENT_AMBULATORY_CARE_PROVIDER_SITE_OTHER): Payer: Managed Care, Other (non HMO) | Admitting: Podiatry

## 2022-03-18 ENCOUNTER — Ambulatory Visit (INDEPENDENT_AMBULATORY_CARE_PROVIDER_SITE_OTHER): Payer: Managed Care, Other (non HMO)

## 2022-03-18 DIAGNOSIS — M778 Other enthesopathies, not elsewhere classified: Secondary | ICD-10-CM | POA: Diagnosis not present

## 2022-03-18 DIAGNOSIS — M7661 Achilles tendinitis, right leg: Secondary | ICD-10-CM | POA: Diagnosis not present

## 2022-03-18 DIAGNOSIS — M2012 Hallux valgus (acquired), left foot: Secondary | ICD-10-CM

## 2022-03-18 MED ORDER — DEXAMETHASONE SODIUM PHOSPHATE 120 MG/30ML IJ SOLN
2.0000 mg | Freq: Once | INTRAMUSCULAR | Status: AC
  Administered 2022-03-18: 2 mg via INTRA_ARTICULAR

## 2022-03-18 MED ORDER — MELOXICAM 15 MG PO TABS: 15.0000 mg | ORAL_TABLET | Freq: Every day | ORAL | 3 refills | Status: DC

## 2022-03-18 MED ORDER — METHYLPREDNISOLONE 4 MG PO TBPK: ORAL_TABLET | ORAL | 0 refills | Status: DC

## 2022-03-18 MED ORDER — TRIAMCINOLONE ACETONIDE 40 MG/ML IJ SUSP
20.0000 mg | Freq: Once | INTRAMUSCULAR | Status: AC
  Administered 2022-03-18: 20 mg

## 2022-03-19 NOTE — Progress Notes (Signed)
Subjective:  Patient ID: April [name], female    DOB: 1966-03-28,  MRN: [id number] HPI Chief Complaint  Patient presents with   Foot Pain    1st MPJ and 2nd toe left - bunion deformity x years, more pain under the 2nd toe, overlaps sometimes and causes a blister  Posterior heel right - aching, bump x 2-3 months, was running during a storm and felt a "zing"   New Patient (Initial Visit)    56 y.o. female presents with the above complaint.   ROS: Denies fever chills nausea vomiting muscle aches pains calf pain back pain chest pain shortness of breath.  Past Medical History:  Diagnosis Date   Allergy    Anemia    in the past   Arthritis    ASCUS with positive high risk HPV 05/2014, 05/2015,05/2016   colposcopy biopsy 05/2014 showed LGSIL recommend repeat Pap smear one year   Asthma    Bite from cat    secondary infection right hand sugery 10/08   Boil, thigh    recurrent boils of thighs- per pt Dr. Phineas Real says from hormones   Dysrhythmia    palpitations (sporadic)   Fibromyalgia    Headache(784.0)    migraines   Hernia    Hypertension    Internal hemorrhoids    Serrated adenoma of colon 2013   Past Surgical History:  Procedure Laterality Date   CHOLECYSTECTOMY  08/12/2011   Procedure: LAPAROSCOPIC CHOLECYSTECTOMY WITH INTRAOPERATIVE CHOLANGIOGRAM;  Surgeon: Adin Hector, MD;  Location: [facility];  Service: General;  Laterality: N/A;   COLONOSCOPY  04/02/2012   W/Dr.Stark   HAND SURGERY     Cat Bite trauma   HYSTEROSCOPY  08/2012   polyps   INCISIONAL HERNIA REPAIR  04/28/2012   Procedure: HERNIA REPAIR INCISIONAL;  Surgeon: Adin Hector, MD;  Location: [facility];  Service: General;  Laterality: N/A;   INSERTION OF MESH  04/28/2012   Procedure: INSERTION OF MESH;  Surgeon: Adin Hector, MD;  Location: [facility];  Service: General;  Laterality: N/A;   IUD REMOVAL N/A 08/13/2012   Procedure: INTRAUTERINE DEVICE (IUD) REMOVAL;  Surgeon: [name], MD;   Location: [facility];  Service: Gynecology;  Laterality: N/A;   PILONIDAL CYST / SINUS EXCISION     TONSILLECTOMY      Current Outpatient Medications:    meloxicam (MOBIC) 15 MG tablet, Take 1 tablet (15 mg total) by mouth daily., Disp: 30 tablet, Rfl: 3   methylPREDNISolone (MEDROL DOSEPAK) 4 MG TBPK tablet, 6 day dose pack - take as directed, Disp: 21 tablet, Rfl: 0   acetaminophen (TYLENOL) 500 MG tablet, Take 500 mg by mouth every 6 (six) hours as needed. For pain, Disp: , Rfl:    BLACK COHOSH PO, Take by mouth., Disp: , Rfl:    calcium-vitamin D (OSCAL WITH D) 500-200 MG-UNIT per tablet, Take 1 tablet by mouth daily., Disp: , Rfl:    estradiol (ESTRACE) 0.5 MG tablet, Take 1 tablet (0.5 mg total) by mouth daily., Disp: 90 tablet, Rfl: 4   loratadine (CLARITIN) 10 MG tablet, Take 10 mg by mouth daily., Disp: , Rfl:    Multiple Vitamin (MULTIVITAMIN) capsule, Take 1 capsule by mouth daily., Disp: , Rfl:    progesterone (PROMETRIUM) 100 MG capsule, Take 1 capsule (100 mg total) by mouth at bedtime., Disp: 90 capsule, Rfl: 4   TURMERIC PO, , Disp: , Rfl:    UNABLE TO FIND, Med Name: brain health vitamin,  Disp: , Rfl:   Allergies  Allergen Reactions   Amoxicillin Hives   Penicillins Hives   Review of Systems Objective:  There were no vitals filed for this visit.  General: Well developed, nourished, in no acute distress, alert and oriented x3   Dermatological: Skin is warm, dry and supple bilateral. Nails x 10 are well maintained; remaining integument appears unremarkable at this time. There are no open sores, no preulcerative lesions, no rash or signs of infection present.  Vascular: Dorsalis Pedis artery and Posterior Tibial artery pedal pulses are 2/4 bilateral with immedate capillary fill time. Pedal hair growth present. No varicosities and no lower extremity edema present bilateral.   Neruologic: Grossly intact via light touch bilateral. Vibratory intact via tuning fork bilateral.  Protective threshold with Semmes Wienstein monofilament intact to all pedal sites bilateral. Patellar and Achilles deep tendon reflexes 2+ bilateral. No Babinski or clonus noted bilateral.   Musculoskeletal: No gross boney pedal deformities bilateral. No pain, crepitus, or limitation noted with foot and ankle range of motion bilateral. Muscular strength 5/5 in all groups tested bilateral.  She has pain on palpation of the posterior aspect of the right heel with a area of fluctuance at the Achilles calcaneal insertion site.  This is most likely a retro-Achilles bursitis.  She has some tenderness on palpation of the Achilles just prior to its insertion site of the right foot.  She also has pain on palpation of the second metatarsal phalangeal joint and on end range of motion of that joint.  She has hallux abductovalgus deformity which is resulting in a poor juxtaposition of the hallux against the second toe most likely resulting in some of her capsulitis.  Gait: Unassisted, Nonantalgic.    Radiographs:  Left foot demonstrates an osseously mature individual with a significant hallux abductovalgus deformity and rotation of the hallux.  She does have an elongated second metatarsal the lesser toes appear to be in the same plane and in good position.  Slightly elevated first metatarsal otherwise.  Some osteoarthritic changes are also noted at that level.  Soft tissue swelling is noted over the lateral aspect of the left foot where she does have a venous pool associated with birthmark.  Right foot demonstrates osseously mature individual rectus in nature some thickening of the Achilles tendon with a very small retrocalcaneal approximately oriented calcaneal heel spur.  Within margins of the Achilles appear to be normal.  Assessment & Plan:   Assessment: Achilles tendinitis insertional bursitis right heel.  Left foot demonstrates hallux abductovalgus deformity with capsulitis of the second metatarsal  phalangeal joint left.  Venous lake left foot  Plan: Discussed etiology pathology conservative versus surgical therapies.  At this point we injected 2 mg of dexamethasone local anesthetic into the bursa posterior right heel.  She tolerated this well with some discomfort however.  We injected around the second metatarsal phalangeal joint left foot with Kenalog and local anesthetic 10 mg was utilized.  Tolerated procedure well we discussed appropriate shoe gear stretching exercises ice therapy and shoe gear modifications started her on methylprednisolone to be followed by meloxicam.  I like to follow-up with her in about 6 weeks.  I did explain to her that most likely her hallux abductovalgus deformity of her left foot will need to be corrected.     Asiah Befort T. [place], [place]

## 2022-04-29 ENCOUNTER — Ambulatory Visit: Payer: 59 | Admitting: Podiatry

## 2022-05-08 ENCOUNTER — Ambulatory Visit: Payer: 59 | Admitting: Podiatry

## 2022-05-20 ENCOUNTER — Ambulatory Visit (INDEPENDENT_AMBULATORY_CARE_PROVIDER_SITE_OTHER): Payer: Managed Care, Other (non HMO) | Admitting: Podiatry

## 2022-05-20 ENCOUNTER — Encounter: Payer: Self-pay | Admitting: Podiatry

## 2022-05-20 VITALS — BP 125/65 | HR 75

## 2022-05-20 DIAGNOSIS — M21612 Bunion of left foot: Secondary | ICD-10-CM | POA: Diagnosis not present

## 2022-05-20 DIAGNOSIS — M7742 Metatarsalgia, left foot: Secondary | ICD-10-CM | POA: Diagnosis not present

## 2022-05-20 DIAGNOSIS — M7752 Other enthesopathy of left foot: Secondary | ICD-10-CM

## 2022-05-20 NOTE — Progress Notes (Signed)
  Subjective:  Patient ID: April [name], female    DOB: Apr 18, 1966,   MRN: [id number]  Chief Complaint  Patient presents with   Follow-up    Patient is here for left foot follow-up bunion.patient states that she is still in a lot of pain sometimes off and on, she is still taking meloxicam.    57 y.o. female presents for follow-up of left foot bunion and plantar ball of second toe pain. Patient relates the right heel is doing better after injection but still having pain in her left foot. Relates meloxicam and medrol did help some. Otherwise still limiting activities.  . Denies any other pedal complaints. Denies n/v/f/c.   Past Medical History:  Diagnosis Date   Allergy    Anemia    in the past   Arthritis    ASCUS with positive high risk HPV 05/2014, 05/2015,05/2016   colposcopy biopsy 05/2014 showed LGSIL recommend repeat Pap smear one year   Asthma    Bite from cat    secondary infection right hand sugery 10/08   Boil, thigh    recurrent boils of thighs- per pt Dr. Phineas Real says from hormones   Dysrhythmia    palpitations (sporadic)   Fibromyalgia    Headache(784.0)    migraines   Hernia    Hypertension    Internal hemorrhoids    Serrated adenoma of colon 2013    Objective:  Physical Exam: Vascular: DP/PT pulses 2/4 bilateral. CFT <3 seconds. Normal hair growth on digits. No edema.  Skin. No lacerations or abrasions bilateral feet. Lateral fifth digit and foot with what appears to be varicose vein malformations and purple discoloration of the skin extending along fifth metatrsal and to fourth from digit.  Musculoskeletal: MMT 5/5 bilateral lower extremities in DF, PF, Inversion and Eversion. Deceased ROM in DF of ankle joint. Tender to second MPJ plantarly. Some pain with ROM. Minimal pain to medial eminence of bunion. Some pain along third MPJ.  Neurological: Sensation intact to light touch.   Assessment:   1. Metatarsalgia of left foot   2. Capsulitis of toe of  left foot   3. Bunion, left foot      Plan:  Patient was evaluated and treated and all questions answered. Discussed capsulitis and inflammation of joint as well as bunion deformity and treatment options with patient.  Radiographs reviewed and discussed with patient from previous. Noted bunion deformity with elongation of the second metatarsal.  Injection offered today. Patient deferred at this time.   Discussed padding and provided metatarsal pad and gel sleeve.  MRI ordered to evaluate area and fifth toe area with vascular malformations.  Discussed if pain does not improve may consider surgery to correct bunion and shorten second metatarsal.  Patient to return  after MRI.    Lorenda Peck, DPM

## 2022-05-22 ENCOUNTER — Encounter: Payer: Self-pay | Admitting: Gastroenterology

## 2022-06-11 ENCOUNTER — Other Ambulatory Visit: Payer: Self-pay | Admitting: Podiatry

## 2022-06-18 ENCOUNTER — Ambulatory Visit (AMBULATORY_SURGERY_CENTER): Payer: Managed Care, Other (non HMO) | Admitting: *Deleted

## 2022-06-18 ENCOUNTER — Ambulatory Visit: Payer: Managed Care, Other (non HMO) | Admitting: Obstetrics & Gynecology

## 2022-06-18 VITALS — Ht 68.0 in | Wt 225.0 lb

## 2022-06-18 DIAGNOSIS — Z800 Family history of malignant neoplasm of digestive organs: Secondary | ICD-10-CM

## 2022-06-18 DIAGNOSIS — Z86010 Personal history of colonic polyps: Secondary | ICD-10-CM

## 2022-06-18 MED ORDER — NA SULFATE-K SULFATE-MG SULF 17.5-3.13-1.6 GM/177ML PO SOLN: 1.0000 | Freq: Once | ORAL | 0 refills | Status: AC

## 2022-06-18 NOTE — Progress Notes (Signed)

## 2022-06-19 ENCOUNTER — Other Ambulatory Visit: Payer: Self-pay | Admitting: Podiatry

## 2022-06-23 MED ORDER — MELOXICAM 15 MG PO TABS: 15.0000 mg | ORAL_TABLET | Freq: Every day | ORAL | 0 refills | Status: DC

## 2022-06-26 ENCOUNTER — Encounter: Payer: Self-pay | Admitting: Gastroenterology

## 2022-07-01 ENCOUNTER — Ambulatory Visit (INDEPENDENT_AMBULATORY_CARE_PROVIDER_SITE_OTHER): Payer: Managed Care, Other (non HMO) | Admitting: Obstetrics & Gynecology

## 2022-07-01 ENCOUNTER — Encounter: Payer: Self-pay | Admitting: Obstetrics & Gynecology

## 2022-07-01 ENCOUNTER — Other Ambulatory Visit (HOSPITAL_COMMUNITY)
Admission: RE | Admit: 2022-07-01 | Discharge: 2022-07-01 | Disposition: A | Discharge status: Home / Self Care | Payer: Managed Care, Other (non HMO) | Source: Ambulatory Visit | Attending: Obstetrics & Gynecology | Admitting: Obstetrics & Gynecology

## 2022-07-01 VITALS — BP 120/78 | HR 105 | Resp 16 | Ht 67.25 in | Wt 225.0 lb

## 2022-07-01 DIAGNOSIS — N72 Inflammatory disease of cervix uteri: Secondary | ICD-10-CM

## 2022-07-01 DIAGNOSIS — Z780 Asymptomatic menopausal state: Secondary | ICD-10-CM | POA: Diagnosis not present

## 2022-07-01 DIAGNOSIS — B977 Papillomavirus as the cause of diseases classified elsewhere: Secondary | ICD-10-CM | POA: Diagnosis not present

## 2022-07-01 DIAGNOSIS — Z01419 Encounter for gynecological examination (general) (routine) without abnormal findings: Secondary | ICD-10-CM | POA: Insufficient documentation

## 2022-07-01 NOTE — Progress Notes (Signed)
April [name] 10-16-1965 [id number]   History:    57 y.o.  G0  Long term boyfriend   RP:  Established patient presenting for annual gyn exam    HPI:  Postmenopausal with some improvement on vasomotor symptoms with Black Cohash. No PMB. No pelvic pain. Pap smear/HPV 05/2021 Negative. Pap reflex today. History of abnormal Pap smears, including LGSIL 2016, ASCUS with positive high-risk HPV in 2018, colposcopy no dysplasia in 2018.  BMI 34.98. Walking.  Breasts normal. Mammogram Neg 02/2022. Colonoscopy 06/2017, will repeat this year.  Father with Colon Ca. Health labs with Fam MD. Flu vaccine declined.  Past medical history,surgical history, family history and social history were all reviewed and documented in the EPIC chart.  Gynecologic History Patient's last menstrual period was 04/25/2015.  Obstetric History OB History  Gravida Para Term Preterm AB Living  0            SAB IAB Ectopic Multiple Live Births                ROS: A ROS was performed and pertinent positives and negatives are included in the history. GENERAL: No fevers or chills. HEENT: No change in vision, no earache, sore throat or sinus congestion. NECK: No pain or stiffness. CARDIOVASCULAR: No chest pain or pressure. No palpitations. PULMONARY: No shortness of breath, cough or wheeze. GASTROINTESTINAL: No abdominal pain, nausea, vomiting or diarrhea, melena or bright red blood per rectum. GENITOURINARY: No urinary frequency, urgency, hesitancy or dysuria. MUSCULOSKELETAL: No joint or muscle pain, no back pain, no recent trauma. DERMATOLOGIC: No rash, no itching, no lesions. ENDOCRINE: No polyuria, polydipsia, no heat or cold intolerance. No recent change in weight. HEMATOLOGICAL: No anemia or easy bruising or bleeding. NEUROLOGIC: No headache, seizures, numbness, tingling or weakness. PSYCHIATRIC: No depression, no loss of interest in normal activity or change in sleep pattern.     Exam:   BP 120/78   Pulse (!)  105   Resp 16   Ht 5' 7.25" (1.708 m)   Wt 225 lb (102.1 kg)   LMP 04/25/2015 Comment: sexually active  SpO2 99%   BMI 34.98 kg/m   Body mass index is 34.98 kg/m.  General appearance : Well developed well nourished female. No acute distress HEENT: Eyes: no retinal hemorrhage or exudates,  Neck supple, trachea midline, no carotid bruits, no thyroidmegaly Lungs: Clear to auscultation, no rhonchi or wheezes, or rib retractions  Heart: Regular rate and rhythm, no murmurs or gallops Breast:Examined in sitting and supine position were symmetrical in appearance, no palpable masses or tenderness,  no skin retraction, no nipple inversion, no nipple discharge, no skin discoloration, no axillary or supraclavicular lymphadenopathy Abdomen: no palpable masses or tenderness, no rebound or guarding Extremities: no edema or skin discoloration or tenderness  Pelvic: Vulva: Normal             Vagina: No gross lesions or discharge  Cervix: No gross lesions or discharge.  Pap reflex done.  Uterus  AV, normal size, shape and consistency, non-tender and mobile  Adnexa  Without masses or tenderness  Anus: Normal   Assessment/Plan:  57 y.o. female for annual exam   1. Encounter for routine gynecological examination with Papanicolaou smear of cervix Postmenopausal with some improvement on vasomotor symptoms with Black Cohash. No PMB. No pelvic pain. Pap smear/HPV 05/2021 Negative. Pap reflex today. History of abnormal Pap smears, including LGSIL 2016, ASCUS with positive high-risk HPV in 2018, colposcopy no dysplasia in 2018.  BMI  34.98. Walking.  Breasts normal. Mammogram Neg 02/2022. Colonoscopy 06/2017, will repeat this year.  Father with Colon Ca. Health labs with Fam MD. Flu vaccine declined. - Cytology - PAP( [place])  2. High risk human papilloma virus (HPV) infection of cervix -Cytology - PAP ([place])  3. Postmenopause  Postmenopausal with some improvement on vasomotor symptoms with  Black Cohash. No PMB. No pelvic pain.   Princess Bruins MD, 3:43 PM

## 2022-07-07 LAB — CYTOLOGY - PAP
Comment: NEGATIVE
Diagnosis: UNDETERMINED — AB
High risk HPV: NEGATIVE

## 2022-07-09 ENCOUNTER — Encounter: Payer: Self-pay | Admitting: Gastroenterology

## 2022-07-09 ENCOUNTER — Ambulatory Visit (AMBULATORY_SURGERY_CENTER): Payer: Managed Care, Other (non HMO) | Admitting: Gastroenterology

## 2022-07-09 VITALS — BP 146/77 | HR 60 | Temp 96.2°F | Resp 15 | Ht 67.5 in | Wt 225.0 lb

## 2022-07-09 DIAGNOSIS — Z800 Family history of malignant neoplasm of digestive organs: Secondary | ICD-10-CM

## 2022-07-09 DIAGNOSIS — Z86010 Personal history of colonic polyps: Secondary | ICD-10-CM

## 2022-07-09 DIAGNOSIS — Z09 Encounter for follow-up examination after completed treatment for conditions other than malignant neoplasm: Secondary | ICD-10-CM | POA: Diagnosis present

## 2022-07-09 MED ORDER — SODIUM CHLORIDE 0.9 % IV SOLN: 500.0000 mL | Freq: Once | INTRAVENOUS | Status: DC

## 2022-07-09 NOTE — Progress Notes (Signed)
See 07/01/2022 H&P, no changes

## 2022-07-09 NOTE — Patient Instructions (Signed)
Information on hemorrhoids given to you today.  Repeat colonoscopy in 5 years for surveillance.  Resume previous diet and medications.    YOU HAD AN ENDOSCOPIC PROCEDURE TODAY AT [facility]:   Refer to the procedure report that was given to you for any specific questions about what was found during the examination.  If the procedure report does not answer your questions, please call your gastroenterologist to clarify.  If you requested that your care partner not be given the details of your procedure findings, then the procedure report has been included in a sealed envelope for you to review at your convenience later.  YOU SHOULD EXPECT: Some feelings of bloating in the abdomen. Passage of more gas than usual.  Walking can help get rid of the air that was put into your GI tract during the procedure and reduce the bloating. If you had a lower endoscopy (such as a colonoscopy or flexible sigmoidoscopy) you may notice spotting of blood in your stool or on the toilet paper. If you underwent a bowel prep for your procedure, you may not have a normal bowel movement for a few days.  Please Note:  You might notice some irritation and congestion in your nose or some drainage.  This is from the oxygen used during your procedure.  There is no need for concern and it should clear up in a day or so.  SYMPTOMS TO REPORT IMMEDIATELY:  Following lower endoscopy (colonoscopy or flexible sigmoidoscopy):  Excessive amounts of blood in the stool  Significant tenderness or worsening of abdominal pains  Swelling of the abdomen that is new, acute  Fever of 100F or higher  For urgent or emergent issues, a gastroenterologist can be reached at any hour by calling [phone]. Do not use MyChart messaging for urgent concerns.    DIET:  We do recommend a small meal at first, but then you may proceed to your regular diet.  Drink plenty of fluids but you should avoid alcoholic beverages for 24  hours.  ACTIVITY:  You should plan to take it easy for the rest of today and you should NOT DRIVE or use heavy machinery until tomorrow (because of the sedation medicines used during the test).    FOLLOW UP: Our staff will call the number listed on your records the next business day following your procedure.  We will call around 7:15- 8:00 am to check on you and address any questions or concerns that you may have regarding the information given to you following your procedure. If we do not reach you, we will leave a message.     If any biopsies were taken you will be contacted by phone or by letter within the next 1-3 weeks.  Please call us at [phone] if you have not heard about the biopsies in 3 weeks.    SIGNATURES/CONFIDENTIALITY: You and/or your care partner have signed paperwork which will be entered into your electronic medical record.  These signatures attest to the fact that that the information above on your After Visit Summary has been reviewed and is understood.  Full responsibility of the confidentiality of this discharge information lies with you and/or your care-partner.

## 2022-07-09 NOTE — Op Note (Signed)
[place] Patient Name: April [name] Procedure Date: 07/09/2022 7:55 AM MRN: [id number] Endoscopist: Ladene Artist , MD, [id number] Age: 57 Referring MD:  Date of Birth: Nov 07, 1965 Gender: Female Account #: [id number] Procedure:                Colonoscopy Indications:              High risk colon cancer surveillance: Personal                            history of sessile serrated colon polyp (less than                            10 mm in size) with no dysplasia, Family history of                            colon cancer, 1st-degree relative and 2nd-degree                            relative Medicines:                Monitored Anesthesia Care Procedure:                Pre-Anesthesia Assessment:                           - Prior to the procedure, a History and Physical                            was performed, and patient medications and                            allergies were reviewed. The patient's tolerance of                            previous anesthesia was also reviewed. The risks                            and benefits of the procedure and the sedation                            options and risks were discussed with the patient.                            All questions were answered, and informed consent                            was obtained. Prior Anticoagulants: The patient has                            taken no anticoagulant or antiplatelet agents. ASA                            Grade Assessment: II - A patient with mild systemic  disease. After reviewing the risks and benefits,                            the patient was deemed in satisfactory condition to                            undergo the procedure.                           After obtaining informed consent, the colonoscope                            was passed under direct vision. Throughout the                            procedure, the patient's blood pressure, pulse, and                             oxygen saturations were monitored continuously. The                            [id number]-HQ190L Colonoscope [id number] was introduced                            through the anus and advanced to the the cecum,                            identified by appendiceal orifice and ileocecal                            valve. The ileocecal valve, appendiceal orifice,                            and rectum were photographed. The quality of the                            bowel preparation was good. The colonoscopy was                            performed without difficulty. The patient tolerated                            the procedure well. Scope In: 7:57:50 AM Scope Out: 8:11:49 AM Scope Withdrawal Time: 0 hours 11 minutes 32 seconds  Total Procedure Duration: 0 hours 13 minutes 59 seconds  Findings:                 External skin tags were found on perianal exam.                           Internal hemorrhoids were found during                            retroflexion. The hemorrhoids were small and Grade  I (internal hemorrhoids that do not prolapse).                           The exam was otherwise without abnormality on                            direct and retroflexion views. Complications:            No immediate complications. Estimated blood loss:                            None. Estimated Blood Loss:     Estimated blood loss: none. Impression:               - Perianal skin tags found on perianal exam.                           - Internal hemorrhoids.                           - The examination was otherwise normal on direct                            and retroflexion views.                           - No specimens collected. Recommendation:           - Repeat colonoscopy in 5 years for surveillance.                           - Patient has a contact number available for                            emergencies. The signs and symptoms of potential                             delayed complications were discussed with the                            patient. Return to normal activities tomorrow.                            Written discharge instructions were provided to the                            patient.                           - Resume previous diet.                           - Continue present medications. Ladene Artist, MD 07/09/2022 8:15:21 AM This report has been signed electronically.

## 2022-07-09 NOTE — Progress Notes (Signed)
Vital signs checked by: DT  The patient states no changes in medical or surgical history since pre-visit screening on 06/18/2022.

## 2022-07-09 NOTE — Progress Notes (Signed)
Report to PACU, RN, vss, BBS= Clear.  

## 2022-07-10 ENCOUNTER — Telehealth: Payer: Self-pay

## 2022-07-10 NOTE — Telephone Encounter (Signed)
  Follow up Call-     07/09/2022    7:25 AM  Call back number  Post procedure Call Back phone  # [phone]  Permission to leave phone message Yes     Patient questions:  Do you have a fever, pain , or abdominal swelling? No. Pain Score  0 *  Have you tolerated food without any problems? Yes.    Have you been able to return to your normal activities? Yes.    Do you have any questions about your discharge instructions: Diet   No. Medications  No. Follow up visit  No.  Do you have questions or concerns about your Care? No.  Actions: * If pain score is 4 or above: No action needed, pain <4.

## 2022-12-02 ENCOUNTER — Other Ambulatory Visit: Payer: Self-pay

## 2022-12-02 ENCOUNTER — Encounter: Payer: Self-pay | Admitting: Allergy and Immunology

## 2022-12-02 ENCOUNTER — Ambulatory Visit (INDEPENDENT_AMBULATORY_CARE_PROVIDER_SITE_OTHER): Payer: Managed Care, Other (non HMO) | Admitting: Allergy and Immunology

## 2022-12-02 VITALS — BP 136/86 | HR 84 | Temp 98.3°F | Resp 18 | Ht 69.0 in | Wt 235.1 lb

## 2022-12-02 DIAGNOSIS — J3089 Other allergic rhinitis: Secondary | ICD-10-CM | POA: Diagnosis not present

## 2022-12-02 DIAGNOSIS — J301 Allergic rhinitis due to pollen: Secondary | ICD-10-CM | POA: Diagnosis not present

## 2022-12-02 DIAGNOSIS — K219 Gastro-esophageal reflux disease without esophagitis: Secondary | ICD-10-CM | POA: Diagnosis not present

## 2022-12-02 DIAGNOSIS — J45998 Other asthma: Secondary | ICD-10-CM

## 2022-12-02 DIAGNOSIS — L500 Allergic urticaria: Secondary | ICD-10-CM | POA: Diagnosis not present

## 2022-12-02 DIAGNOSIS — T781XXA Other adverse food reactions, not elsewhere classified, initial encounter: Secondary | ICD-10-CM

## 2022-12-02 DIAGNOSIS — J45901 Unspecified asthma with (acute) exacerbation: Secondary | ICD-10-CM

## 2022-12-02 MED ORDER — FAMOTIDINE 40 MG PO TABS: 40.0000 mg | ORAL_TABLET | Freq: Every evening | ORAL | 1 refills | Status: DC

## 2022-12-02 MED ORDER — CETIRIZINE HCL 10 MG PO TABS: 20.0000 mg | ORAL_TABLET | Freq: Two times a day (BID) | ORAL | 1 refills | Status: AC

## 2022-12-02 MED ORDER — RYALTRIS 665-25 MCG/ACT NA SUSP: 2.0000 | Freq: Two times a day (BID) | NASAL | 5 refills | Status: AC

## 2022-12-02 MED ORDER — RYALTRIS 665-25 MCG/ACT NA SUSP: 2.0000 | Freq: Two times a day (BID) | NASAL | 5 refills | Status: DC

## 2022-12-02 MED ORDER — OMEPRAZOLE 40 MG PO CPDR: 40.0000 mg | DELAYED_RELEASE_CAPSULE | Freq: Every morning | ORAL | 1 refills | Status: DC

## 2022-12-02 NOTE — Progress Notes (Unsigned)
[place] - [place] - [place] - [place] - [place]   Dear Dr. Thomasena [name],  Thank you for referring April [name] to the [facility] [place] on 12/02/2022.   Below is a summation of this patient's evaluation and recommendations.  Thank you for your referral. I will keep you informed about this patient's response to treatment.   If you have any questions please do not hesitate to contact me.   Sincerely,  April Priest, MD Allergy / Immunology [place] [facility]   ______________________________________________________________________    NEW PATIENT NOTE  Referring Provider: Irena Reichmann, DO Primary Provider: Irena Reichmann, DO Date of office visit: 12/02/2022    Subjective:   Chief Complaint:  April [name] (DOB: 04/25/1966) is a 57 y.o. female who presents to the clinic on 12/02/2022 with a chief complaint of Allergic Reaction (Says she consumes peanuts and began to itch on upper body, swollen eyes. Says she took benadryl for ~4/5 days until reaction was completely gone. ), Asthma (Was put on inhaler after pneumonia. ), and Allergic Rhinitis  (Says she has issues with allergies. ) .     HPI: April [name] presents to this clinic in evaluation of several issues.  Approximately 2 months ago she had a 5-day event of developing diffuse pruritus affecting her torso and face and extremities associated with some eye swelling but no other associated systemic or constitutional symptoms.  She did start Zyrtec to replace her daily Claritin around that point in time and this may have helped somewhat but she still had itchiness for several days of using Zyrtec and then everything spontaneously resolved.  She actually had another episode last week that lasted approximately half a day again without any associated systemic or constitutional symptoms and without any eye swelling.  There was not really an  obvious provoking factor giving rise to this itchiness.  She was eating a lot of peanut butter around that point in time in which this issue developed.  She states that she had 4 days of peanut butter and peanuts prior to this reaction but she completely stopped all peanut butter at the beginning of this reaction and yet still continues to have about 5 days of this reaction.  And with her latest event that lasted approximately a half a day last week she did not consume any peanut butter.  She has nasal congestion and sneezing.  She wakes up every morning with congestion in her nose and it feels as though her ears are plugged up.  She is using azelastine nasal spray which is not really helping this issue.  Provoking factors for her upper respiratory tract symptoms include dust and locating to the outdoors.  There does appear to be seasonality associated with this issue although she does have symptoms all year.  She also has coughing.  When she wakes up in the morning she has a coughing spell that is associated with gagging and retching and she cannot catch her breath.  She really has no chest tightness or shortness of breath or wheezing although she thinks that when she lays down at nighttime she may have some "crackling" very high up in her chest.  She was apparently given an albuterol inhaler in the past which did nothing.  She has lots of throat clearing and she has phlegm stuck in her throat and drainage in her throat and intermittent raspy voice.  She does not have any classic  reflux symptoms.  She drinks about 12 ounces of coffee in the morning and occasionally has some tea during the winter and occasionally drinks some wine at night and occasionally consumes some chocolate.  She has headaches.  She has headaches at least 3 times per week usually upon awakening in the morning.  It affects her right eye and the right nose and migrates to the top of her head.  Sometimes it can involve other areas but  mostly involves the right side of her head.  It is pressure-like feeling as though something is pushing out.  Sometimes it can last 2 hours and sometimes all day.  If she takes an Excedrin, which she takes with a frequency of twice a month, it usually helps.  About every 2 to 3 months she ends up in bed vomiting with her headache.  Past Medical History:  Diagnosis Date   Allergy    seasonal   Anemia    in the past   Arthritis    ASCUS with positive high risk HPV 05/2014, 05/2015,05/2016   colposcopy biopsy 05/2014 showed LGSIL recommend repeat Pap smear one year   Asthma    Bite from cat    secondary infection right hand sugery 10/08   Boil, thigh    recurrent boils of thighs- per pt Dr. Audie Box says from hormones   Dysrhythmia    palpitations (sporadic)   Fibromyalgia    Headache(784.0)    migraines   Hernia    Hypertension    Internal hemorrhoids    Serrated adenoma of colon 2013    Past Surgical History:  Procedure Laterality Date   CHOLECYSTECTOMY  08/12/2011   Procedure: LAPAROSCOPIC CHOLECYSTECTOMY WITH INTRAOPERATIVE CHOLANGIOGRAM;  Surgeon: Ernestene Mention, MD;  Location: [facility];  Service: General;  Laterality: N/A;   COLONOSCOPY  04/02/2012   W/Dr.Stark   HAND SURGERY     Cat Bite trauma   HYSTEROSCOPY  08/2012   polyps   INCISIONAL HERNIA REPAIR  04/28/2012   Procedure: HERNIA REPAIR INCISIONAL;  Surgeon: Ernestene Mention, MD;  Location: [facility];  Service: General;  Laterality: N/A;   INSERTION OF MESH  04/28/2012   Procedure: INSERTION OF MESH;  Surgeon: Ernestene Mention, MD;  Location: [facility];  Service: General;  Laterality: N/A;   IUD REMOVAL N/A 08/13/2012   Procedure: INTRAUTERINE DEVICE (IUD) REMOVAL;  Surgeon: Dara Lords, MD;  Location: [facility];  Service: Gynecology;  Laterality: N/A;   PILONIDAL CYST / SINUS EXCISION     TONSILLECTOMY     WISDOM TOOTH EXTRACTION      Allergies as of 12/02/2022       Reactions   Amoxicillin Hives   Penicillins  Hives        Medication List    AVEENO ACTIVE NAT SKIN RELIEF EX Apply 1 Application topically daily.   BLACK COHOSH PO Take by mouth daily.   calcium-vitamin D 500-200 MG-UNIT tablet Commonly known as: OSCAL WITH D Take 1 tablet by mouth daily.   [employer] SUNSCREEN SPF 30 EX Apply 1 Application topically daily as needed.   cyclobenzaprine 5 MG tablet Commonly known as: FLEXERIL Take 5-10 mg by mouth as needed.   multivitamin capsule Take 1 capsule by mouth daily.   QC Tumeric Complex 500 MG Caps Generic drug: Turmeric Take 500 mg by mouth daily.   UNABLE TO FIND Med Name: brain health vitamin    Review of systems negative except as noted in HPI / PMHx  or noted below:  Review of Systems  Constitutional: Negative.   HENT: Negative.    Eyes: Negative.   Respiratory: Negative.    Cardiovascular: Negative.   Gastrointestinal: Negative.   Genitourinary: Negative.   Musculoskeletal: Negative.   Skin: Negative.   Neurological: Negative.   Endo/Heme/Allergies: Negative.   Psychiatric/Behavioral: Negative.      Family History  Problem Relation Age of Onset   Asthma Mother    Colon polyps Mother    Stroke Mother        TIA   Heart disease Mother    Breast cancer Mother 87   Aneurysm Mother        brain   Colon polyps Father    Diabetes Father    Colon cancer Father 63   Breast cancer Maternal Grandmother        Age 83   Hypertension Paternal Grandmother    Colon cancer Paternal Grandfather    Crohn's disease Cousin    Esophageal cancer Neg Hx    Stomach cancer Neg Hx     Social History   Socioeconomic History   Marital status: Single    Spouse name: Not on file   Number of children: Not on file   Years of education: Not on file   Highest education level: Not on file  Occupational History   Not on file  Tobacco Use   Smoking status: Former    Current packs/day: 0.00    Types: Cigarettes    Quit date: 07/15/1998    Years since quitting: 24.4    Smokeless tobacco: Never  Vaping Use   Vaping status: Never Used  Substance and Sexual Activity   Alcohol use: Yes    Alcohol/week: 3.0 standard drinks of alcohol    Types: 3 Glasses of wine per week    Comment: Social   Drug use: Never   Sexual activity: Yes    Partners: Male    Birth control/protection: Post-menopausal    Comment: 1st intercourse 57 yo-More than 5 partners  Other Topics Concern   Not on file  Social History Narrative   Not on file    Environmental and Social history  Lives in a townhouse with a dry environment, a dog located inside the household, carpet in the bedroom, no plastic on the bed, no plastic on the pillow, no smoking ongoing with inside the household.  She works in a office setting.  Objective:   Vitals:   12/02/22 0934  BP: 136/86  Pulse: 84  Resp: 18  Temp: 98.3 F (36.8 C)  SpO2: 97%   Height: 5\' 9"  (175.3 cm) Weight: 235 lb 1.6 oz (106.6 kg)  Physical Exam Constitutional:      Appearance: She is not diaphoretic.  HENT:     Head: Normocephalic.     Right Ear: Tympanic membrane, ear canal and external ear normal.     Left Ear: Tympanic membrane, ear canal and external ear normal.     Nose: Nose normal. No mucosal edema or rhinorrhea.     Mouth/Throat:     Pharynx: Uvula midline. No oropharyngeal exudate.  Eyes:     Conjunctiva/sclera: Conjunctivae normal.  Neck:     Thyroid: No thyromegaly.     Trachea: Trachea normal. No tracheal tenderness or tracheal deviation.  Cardiovascular:     Rate and Rhythm: Normal rate and regular rhythm.     Heart sounds: Normal heart sounds, S1 normal and S2 normal. No murmur heard. Pulmonary:  Effort: No respiratory distress.     Breath sounds: Normal breath sounds. No stridor. No wheezing or rales.  Lymphadenopathy:     Head:     Right side of head: No tonsillar adenopathy.     Left side of head: No tonsillar adenopathy.     Cervical: No cervical adenopathy.  Skin:    Findings: No  erythema or rash.     Nails: There is no clubbing.  Neurological:     Mental Status: She is alert.     Diagnostics: Allergy skin tests were performed.  She demonstrated hypersensitivity against tree pollen, grass pollen, mold.  Assessment and Plan:    1. Allergic urticaria   2. Perennial allergic rhinitis   3. Seasonal allergic rhinitis due to pollen   4. LPRD (laryngopharyngeal reflux disease)   5. Adverse food reaction, initial encounter    1. Allergen avoidance measures - tree, grass, mold (peanuts???)  2. Treat and prevent inflammation of airway:   A. Ryaltris - 2 sprays each nostril 1-2 times per day (specialty pharmacy)  3. Treat and prevent reflux induced airway irritation;   A. Minimize consumption of all forms of caffeine  B. Replace throat clearing with drinking / swallowing maneuver  C. Omeprazole 40 mg - 1 tablet in AM  D. Famotidine 40 mg - 1 tablet in PM  4. Treat and prevent headaches:   A. Minimize consumption of all forms of caffeine  5. If needed:   A. Cetirizine 10 mg - 1-2 tablets 1-2 times per day (MAX=40mg /day)  6. Blood - CBC w/d, CMP, Thyroid peroxidase ab, TSH, FT4, peanut components   7. Return to clinic in 4 weeks or earlier if problem  8. Review last chest x-ray  Dellanira has a inflamed and irritated airway and this is on the basis of not just her atopic disease but probably a component of LPR.  We will have her utilize the plan noted above which includes anti-inflammatory agents for her upper airway and therapy directed against reflux.  As well, she has very frequent headaches and we will have her minimize consumption of caffeine and she will require further evaluation for headaches if she continues with the pattern she so describes today.  For her episode of urticaria and pruritus we will have her obtain the blood test noted above looking for systemic disease contributing to immune activation and also further investigating the possibility of  peanut allergy.  I will see her back in this clinic in 4 weeks.  April Priest, MD Allergy / Immunology [facility] [place]

## 2022-12-02 NOTE — Patient Instructions (Addendum)
  1. Allergen avoidance measures  2. Treat and prevent inflammation of airway:   A. Ryaltris - 2 sprays each nostril 1-2 times per day (specialty pharmacy)  3. Treat and prevent reflux induced airway irritation;   A. Minimize consumption of all forms of caffeine  B. Replace throat clearing with drinking / swallowing maneuver  C. Omeprazole 40 mg - 1 tablet in AM  D. Famotidine 40 mg - 1 tablet in PM  4. Treat and prevent headaches:   A. Minimize consumption of all forms of caffeine  5. If needed:   A. Cetirizine 10 mg - 1-2 tablets 1-2 times per day (MAX=40mg /day)  6. Blood - CBC w/d, CMP, Thyroid peroxidase ab, TSH, FT4, peanut components   7. Return to clinic in 4 weeks or earlier if problem  8. Review last chest x-ray

## 2022-12-03 ENCOUNTER — Encounter: Payer: Self-pay | Admitting: Allergy and Immunology

## 2022-12-03 LAB — CBC WITH DIFFERENTIAL
Basophils Absolute: 0.1 10*3/uL (ref 0.0–0.2)
Basos: 1 %
EOS (ABSOLUTE): 0.4 10*3/uL (ref 0.0–0.4)
Eos: 3 %
Hematocrit: 44.5 % (ref 34.0–46.6)
Hemoglobin: 14.6 g/dL (ref 11.1–15.9)
Immature Grans (Abs): 0 10*3/uL (ref 0.0–0.1)
Immature Granulocytes: 0 %
Lymphocytes Absolute: 2.3 10*3/uL (ref 0.7–3.1)
Lymphs: 21 %
MCH: 27.4 pg (ref 26.6–33.0)
MCHC: 32.8 g/dL (ref 31.5–35.7)
MCV: 84 fL (ref 79–97)
Monocytes Absolute: 0.7 10*3/uL (ref 0.1–0.9)
Monocytes: 7 %
Neutrophils Absolute: 7.4 10*3/uL — ABNORMAL HIGH (ref 1.4–7.0)
Neutrophils: 68 %
RBC: 5.33 x10E6/uL — ABNORMAL HIGH (ref 3.77–5.28)
RDW: 13.3 % (ref 11.7–15.4)
WBC: 10.9 10*3/uL — ABNORMAL HIGH (ref 3.4–10.8)

## 2022-12-03 LAB — THYROID PEROXIDASE ANTIBODY: Thyroperoxidase Ab SerPl-aCnc: 30 IU/mL (ref 0–34)

## 2022-12-03 LAB — T4, FREE: Free T4: 1.4 ng/dL (ref 0.82–1.77)

## 2022-12-03 LAB — COMPREHENSIVE METABOLIC PANEL
ALT: 17 IU/L (ref 0–32)
AST: 22 IU/L (ref 0–40)
Albumin: 4.8 g/dL (ref 3.8–4.9)
Alkaline Phosphatase: 154 IU/L — ABNORMAL HIGH (ref 44–121)
BUN/Creatinine Ratio: 19 (ref 9–23)
BUN: 15 mg/dL (ref 6–24)
Bilirubin Total: 0.3 mg/dL (ref 0.0–1.2)
CO2: 25 mmol/L (ref 20–29)
Calcium: 10.8 mg/dL — ABNORMAL HIGH (ref 8.7–10.2)
Chloride: 99 mmol/L (ref 96–106)
Creatinine, Ser: 0.79 mg/dL (ref 0.57–1.00)
Globulin, Total: 2.5 g/dL (ref 1.5–4.5)
Glucose: 97 mg/dL (ref 70–99)
Potassium: 4.9 mmol/L (ref 3.5–5.2)
Sodium: 141 mmol/L (ref 134–144)
Total Protein: 7.3 g/dL (ref 6.0–8.5)
eGFR: 87 mL/min/{1.73_m2} (ref >59)

## 2022-12-03 LAB — IGE PEANUT COMPONENT PROFILE

## 2022-12-03 LAB — TSH: TSH: 1.17 u[IU]/mL (ref 0.450–4.500)

## 2022-12-04 ENCOUNTER — Telehealth: Payer: Self-pay

## 2022-12-04 ENCOUNTER — Other Ambulatory Visit (HOSPITAL_COMMUNITY): Payer: Self-pay

## 2022-12-04 NOTE — Addendum Note (Signed)
Addended by: Philipp Deputy on: 12/04/2022 05:46 PM   Modules accepted: Orders

## 2022-12-04 NOTE — Telephone Encounter (Signed)
Pharmacy Patient Advocate Encounter  Received notification from  [employer]  that Prior Authorization for Omeprazole 40MG  dr Jaclynn Major been CANCELLED due to; this is a plan/benefit exclusion  PA #/Case ID/Reference #: [id number]

## 2022-12-08 MED ORDER — PANTOPRAZOLE SODIUM 40 MG PO TBEC: 40.0000 mg | DELAYED_RELEASE_TABLET | Freq: Every day | ORAL | 1 refills | Status: DC

## 2022-12-08 NOTE — Addendum Note (Signed)
Addended by: Rolland Bimler D on: 12/08/2022 01:40 PM   Modules accepted: Orders

## 2022-12-09 ENCOUNTER — Telehealth: Payer: Self-pay | Admitting: Allergy and Immunology

## 2022-12-09 NOTE — Telephone Encounter (Signed)
Spoke with patient, informed her of her lab results. Patient verbalized understanding.

## 2022-12-09 NOTE — Telephone Encounter (Signed)
Patient called stating she received a call about lab results and was following up about that call.

## 2022-12-09 NOTE — Addendum Note (Signed)
Addended by: Philipp Deputy on: 12/09/2022 06:35 PM   Modules accepted: Orders

## 2022-12-11 ENCOUNTER — Other Ambulatory Visit (HOSPITAL_COMMUNITY): Payer: Self-pay

## 2022-12-12 ENCOUNTER — Other Ambulatory Visit (HOSPITAL_COMMUNITY): Payer: Self-pay

## 2022-12-12 ENCOUNTER — Telehealth: Payer: Self-pay

## 2022-12-12 NOTE — Telephone Encounter (Signed)
Pharmacy Patient Advocate Encounter   Received notification from CoverMy[employer]eds that prior authorization for Pantoprazole Sodium 40MG  dr tablets is required/requested.   Insurance verification completed.   The patient is insured through  [employer]  .   Prior Authorization for Pantoprazole Sodium 40MG  dr tablets has been DENIED. Please advise how you'd like to proceed. Full denial letter will be uploaded to the media tab. See denial reason below.  Product/Service Not Covered - Plan/Benefit Exclusion  PA #/Case ID/Reference #: [id number]  *Note, no alternatives provided by insurance.

## 2023-01-06 ENCOUNTER — Ambulatory Visit: Payer: Managed Care, Other (non HMO) | Admitting: Allergy and Immunology

## 2023-03-27 ENCOUNTER — Other Ambulatory Visit: Payer: Self-pay | Admitting: Medical Genetics

## 2023-03-27 DIAGNOSIS — Z006 Encounter for examination for normal comparison and control in clinical research program: Secondary | ICD-10-CM

## 2023-04-01 ENCOUNTER — Ambulatory Visit: Payer: Managed Care, Other (non HMO) | Admitting: Podiatry

## 2023-04-21 ENCOUNTER — Ambulatory Visit (INDEPENDENT_AMBULATORY_CARE_PROVIDER_SITE_OTHER): Payer: Managed Care, Other (non HMO)

## 2023-04-21 ENCOUNTER — Ambulatory Visit (INDEPENDENT_AMBULATORY_CARE_PROVIDER_SITE_OTHER): Payer: Managed Care, Other (non HMO) | Admitting: Podiatry

## 2023-04-21 DIAGNOSIS — M21612 Bunion of left foot: Secondary | ICD-10-CM | POA: Diagnosis not present

## 2023-04-21 DIAGNOSIS — M2012 Hallux valgus (acquired), left foot: Secondary | ICD-10-CM

## 2023-04-21 DIAGNOSIS — M7742 Metatarsalgia, left foot: Secondary | ICD-10-CM | POA: Diagnosis not present

## 2023-04-21 DIAGNOSIS — E559 Vitamin D deficiency, unspecified: Secondary | ICD-10-CM

## 2023-04-21 DIAGNOSIS — M21619 Bunion of unspecified foot: Secondary | ICD-10-CM

## 2023-04-21 DIAGNOSIS — M2011 Hallux valgus (acquired), right foot: Secondary | ICD-10-CM

## 2023-04-23 NOTE — Progress Notes (Signed)
Subjective:  Patient ID: April [name], female    DOB: 18-Sep-1965,  MRN: [id number]  Chief Complaint  Patient presents with   Bunions    Patient is here for left foot bunion    Discussed the use of AI scribe software for clinical note transcription with the patient, who gave verbal consent to proceed.  History of Present Illness   The patient, with a long-standing history of a left foot bunion, reports worsening pain despite attempts at nonsurgical management including wider shoes, padding offloading, and anti-inflammatories. The patient describes the pain as located on the bunion and under the second metatarsal head. The patient also reports a sensation of walking on something under the second metatarsal head. The patient has noticed that the great toe is turning to the side, causing painful calluses. The patient also reports that the second toe is numb. The patient has noticed that the foot condition is affecting her gait and causing ankle pain. The patient is interested in surgical intervention for symptom relief.          Objective:    Physical Exam   MUSCULOSKELETAL: Left foot exhibits hallux valgus deformity with frontal plane valgus rotation of the great toe and a large bunion medially. Hypermobility of the first ray noted. Pain and tenderness under the second metatarsal head with no gross dislocation or instability of the second MTP joint.       No images are attached to the encounter.    Results   RADIOLOGY Left foot radiographs: Good bone density with mature osseous hallux valgus deformity, large intermetatarsal angle, frontal plane rotation of the first ray, deviation of the sesamoids, and an elongated second ray.      Assessment:   1. Hallux valgus, left   2. Metatarsalgia of left foot   3. Vitamin D deficiency   4. Bunion      Plan:  Patient was evaluated and treated and all questions answered.  Assessment and Plan    Hallux Valgus Deformity  (Bunion) She presents with a painful bunion, characterized by hypermobility of the first ray and frontal plane valgus rotation of the great toe, having failed conservative management. We discussed surgical correction options, including Lapidus bunionectomy with arthrodesis of the first TMT joint, possible Aitken osteotomy, and a potential shortening osteotomy of the second MTP joint. We will schedule surgery at a mutually agreeable date, order lab work to assess vitamin D and calcium levels, and advise on post-operative diet and physical therapy.  Pain under Second Metatarsal Head The discomfort under the second metatarsal head likely stems from overloading of the second MTP and second metatarsal head. We discussed addressing this issue with a potential shortening osteotomy of the second MTP joint during the planned bunion surgery.  Venous Tumor She has had a venous tumor since birth, which has been occasionally symptomatic but has become less so recently. No current intervention is required, but we will monitor for any changes or increased symptoms.       All risks, benefits and potential complications including but not limited to pain, swelling, infection, scar, numbness which may be temporary or permanent, chronic pain, stiffness, nerve pain or damage, wound healing problems, bone healing problems including delayed or non-union discussed. All questions addressed. Informed consent signed and reviewed.     Surgical plan:  Procedure: -Left foot Lapidus bunionectomy, possible Akin osteotomy, Weil osteotomy of the second metatarsal  Location: -[facility]  Anesthesia plan: -IV sedation with regional  Postoperative pain plan: - Tylenol  1000 mg every 6 hours, ibuprofen 600 mg every 6 hours, gabapentin 300 mg every 8 hours x5 days, oxycodone 5 mg 1-2 tabs every 6 hours only as needed  DVT prophylaxis: -Aspirin 81 mg  WB Restrictions / DME needs: -Nonweightbearing CAM boot   No follow-ups on  file.

## 2023-04-27 ENCOUNTER — Other Ambulatory Visit (HOSPITAL_COMMUNITY): Payer: Self-pay | Attending: Medical Genetics

## 2023-05-05 LAB — CALCIUM: Calcium: 10.1 mg/dL (ref 8.7–10.2)

## 2023-05-05 LAB — VITAMIN D 25 HYDROXY (VIT D DEFICIENCY, FRACTURES): Vit D, 25-Hydroxy: 21.2 ng/mL — ABNORMAL LOW (ref 30.0–100.0)

## 2023-05-11 MED ORDER — VITAMIN D (ERGOCALCIFEROL) 1.25 MG (50000 UNIT) PO CAPS: 50000.0000 [IU] | ORAL_CAPSULE | ORAL | 0 refills | Status: DC

## 2023-05-11 NOTE — Addendum Note (Signed)
Addended byLilian Kapur, [place] R on: 05/11/2023 09:43 AM   Modules accepted: Orders

## 2023-05-15 ENCOUNTER — Telehealth: Payer: Self-pay | Admitting: Podiatry

## 2023-05-15 NOTE — Telephone Encounter (Signed)
 DOS-06/19/23  April [name] LT- [id number] LAPIDUS BUNIONECTOMY [id number] MET. OSTEOTOMY LT- [id number]  [employer] EFFECTIVE DATE- 11/07/21   SPOKE WITH IDELL D. FROM [employer] [facility]) AND SHE STATED THAT PRIOR AUTH IS NOT REQUIRED FOR CPT CODES [phone] AND [id number].  CALL REFERENCE NUMBER: JO D. 05/15/23 @ 10:15 AM EST

## 2023-06-16 ENCOUNTER — Other Ambulatory Visit: Payer: Self-pay | Admitting: Podiatry

## 2023-06-16 ENCOUNTER — Telehealth: Payer: Self-pay | Admitting: Urology

## 2023-06-16 MED ORDER — OXYCODONE HCL 5 MG PO TABS: 5.0000 mg | ORAL_TABLET | Freq: Four times a day (QID) | ORAL | 0 refills | Status: AC | PRN

## 2023-06-16 MED ORDER — ACETAMINOPHEN 500 MG PO TABS: 1000.0000 mg | ORAL_TABLET | Freq: Four times a day (QID) | ORAL | 0 refills | Status: AC | PRN

## 2023-06-16 MED ORDER — GABAPENTIN 300 MG PO CAPS: 300.0000 mg | ORAL_CAPSULE | Freq: Three times a day (TID) | ORAL | 0 refills | Status: DC

## 2023-06-16 MED ORDER — IBUPROFEN 600 MG PO TABS: 600.0000 mg | ORAL_TABLET | Freq: Four times a day (QID) | ORAL | 0 refills | Status: AC | PRN

## 2023-06-16 NOTE — Progress Notes (Signed)
2/7 Bunionectomy and 2nd Gerhard Munch

## 2023-06-16 NOTE — Telephone Encounter (Addendum)
I called and LM to call back so I can inform her of this.

## 2023-06-16 NOTE — Telephone Encounter (Signed)
 Pt returned my call, I informed her of what Dr. Silva advised, she stated that she would just see him day of sx, she is confident that she doesn't have an infection, I explained to her again that if he saw signs of infection that her sx would be rescheduled. She stated understanding and she would she Dr. Silva on Friday in Pre-op.

## 2023-06-16 NOTE — Telephone Encounter (Signed)
 Pt called stating that she is scheduled for sx on 06/19/23 with Dr. Silva. On 06/12/23 pt was put on Doxy for a blocked oil gland on upper eyelid, she doesn't currently have an infection that why the doctor pt her on doxy for 10 days to prevent an infection. Pt wants to know if she can still have sx with you on 06/19/23? Please Advise, Thanks.

## 2023-06-19 DIAGNOSIS — M2012 Hallux valgus (acquired), left foot: Secondary | ICD-10-CM

## 2023-06-19 DIAGNOSIS — M7742 Metatarsalgia, left foot: Secondary | ICD-10-CM

## 2023-06-25 ENCOUNTER — Encounter: Payer: Self-pay | Admitting: Podiatry

## 2023-06-25 ENCOUNTER — Ambulatory Visit (INDEPENDENT_AMBULATORY_CARE_PROVIDER_SITE_OTHER): Payer: Managed Care, Other (non HMO) | Admitting: Podiatry

## 2023-06-25 ENCOUNTER — Ambulatory Visit (INDEPENDENT_AMBULATORY_CARE_PROVIDER_SITE_OTHER): Payer: Managed Care, Other (non HMO)

## 2023-06-25 VITALS — Ht 69.0 in | Wt 235.1 lb

## 2023-06-25 DIAGNOSIS — M21612 Bunion of left foot: Secondary | ICD-10-CM | POA: Diagnosis not present

## 2023-06-25 NOTE — Progress Notes (Signed)
  Subjective:  Patient ID: April [name], female    DOB: Mar 09, 1966,  MRN: [id number]  Chief Complaint  Patient presents with   Routine Post Op    Pt is here for her 1st post op visit since surgery for left foot, states there is still some pain but other than that she has no complaints.    DOS: 06/18/2022 Procedure: Left foot bunionectomy and second metatarsal osteotomy  58 y.o. female returns for post-op check.  Pain is well-controlled she has not had to take any oxycodone  Review of Systems: Negative except as noted in the HPI. Denies N/V/F/Ch.   Objective:  There were no vitals filed for this visit. Body mass index is 34.72 kg/m. Constitutional Well developed. Well nourished.  Vascular Foot warm and well perfused. Capillary refill normal to all digits.  Calf is soft and supple, no posterior calf or knee pain, negative Homans' sign  Neurologic Normal speech. Oriented to person, place, and time. Epicritic sensation to light touch grossly present bilaterally.  Dermatologic Skin healing well without signs of infection. Skin edges well coapted without signs of infection.  Orthopedic: Tenderness to palpation noted about the surgical site.   Multiple view plain film radiographs: Good correction noted hardware intact and in position Assessment:   1. Bunion, left foot    Plan:  Patient was evaluated and treated and all questions answered.  S/p foot surgery left -Progressing as expected post-operatively. -XR: Noted above no complications -WB Status: NWB in CAM boot.  May begin gradual heel weightbearing Monday -Sutures: Return to weeks to remove nonabsorbable sutures. -Medications: No refills required -Foot redressed.  May remove Monday and shower.  Steri-Strips dispensed to reinforce  Of note her significant other April [name] had a vasovagal response today during the visit his blood pressure was taken and he was normotensive and had regular rate and rhythm, no other associated  signs or symptoms he recuperated well after having some water and juice and crackers   No follow-ups on file.

## 2023-07-07 ENCOUNTER — Ambulatory Visit: Payer: Managed Care, Other (non HMO) | Admitting: Obstetrics and Gynecology

## 2023-07-09 ENCOUNTER — Encounter: Payer: Self-pay | Admitting: Podiatry

## 2023-07-09 ENCOUNTER — Ambulatory Visit (INDEPENDENT_AMBULATORY_CARE_PROVIDER_SITE_OTHER): Payer: Managed Care, Other (non HMO) | Admitting: Podiatry

## 2023-07-09 DIAGNOSIS — M2012 Hallux valgus (acquired), left foot: Secondary | ICD-10-CM

## 2023-07-09 DIAGNOSIS — M21612 Bunion of left foot: Secondary | ICD-10-CM

## 2023-07-09 DIAGNOSIS — M7742 Metatarsalgia, left foot: Secondary | ICD-10-CM

## 2023-07-09 DIAGNOSIS — E7800 Pure hypercholesterolemia, unspecified: Secondary | ICD-10-CM | POA: Insufficient documentation

## 2023-07-09 NOTE — Progress Notes (Signed)
  Subjective:  Patient ID: April [name], female    DOB: March 12, 1966,  MRN: [id number]  Chief Complaint  Patient presents with   Routine Post Op    DOS 06/19/23, LEFT FOOT BUNION CORRECTION, POSSIBLE SECOND METATARSAL SHORTENING Pt stated that she is doing so much better the pain has gotten a lot better she stated that at times she does have some throbbing sensations     DOS: 06/18/2022 Procedure: Left foot bunionectomy and second metatarsal osteotomy  58 y.o. female returns for post-op check.  Doing very well not having much pain at all  Review of Systems: Negative except as noted in the HPI. Denies N/V/F/Ch.   Objective:  There were no vitals filed for this visit. There is no height or weight on file to calculate BMI. Constitutional Well developed. Well nourished.  Vascular Foot warm and well perfused. Capillary refill normal to all digits.  Calf is soft and supple, no posterior calf or knee pain, negative Homans' sign  Neurologic Normal speech. Oriented to person, place, and time. Epicritic sensation to light touch grossly present bilaterally.  Dermatologic Skin healing well without signs of infection. Skin edges well coapted without signs of infection.  Orthopedic: Tenderness to palpation noted about the surgical site.   Multiple view plain film radiographs: Good correction noted hardware intact and in position Assessment:   1. Bunion, left foot   2. Hallux valgus, left   3. Metatarsalgia of left foot    Plan:  Patient was evaluated and treated and all questions answered.  S/p foot surgery left -Doing well sutures removed uneventfully.  She we will continue regular bathing, toe spacer dispensed to encourage range of motion of the MTP joint which she will perform.  Compression sleeve applied.  Continue use of CAM boot she may begin weightbearing as tolerated gradually.  Return to see me in 3 weeks for new radiographs.  No follow-ups on file.

## 2023-07-28 ENCOUNTER — Ambulatory Visit (INDEPENDENT_AMBULATORY_CARE_PROVIDER_SITE_OTHER)

## 2023-07-28 ENCOUNTER — Ambulatory Visit (INDEPENDENT_AMBULATORY_CARE_PROVIDER_SITE_OTHER): Payer: Managed Care, Other (non HMO) | Admitting: Podiatry

## 2023-07-28 DIAGNOSIS — M21612 Bunion of left foot: Secondary | ICD-10-CM

## 2023-07-28 NOTE — Patient Instructions (Signed)
 Per Dr. [name] Pt is able to go into a running shoe perferably. She can begin walking for exercise in 2 weeks. Continue to wear compression sleeve.

## 2023-07-30 ENCOUNTER — Encounter: Payer: Managed Care, Other (non HMO) | Admitting: Podiatry

## 2023-08-02 NOTE — Progress Notes (Signed)
  Subjective:  Patient ID: April [name], female    DOB: 10/22/1965,  MRN: [id number]  Chief Complaint  Patient presents with   Routine Post Op    Rm 5: Bunion Left ft POS Vist #3. Pt states that she is doing well. No swelling and no pain noted. Has not had to take pain medication.    DOS: 06/18/2022 Procedure: Left foot bunionectomy and second metatarsal osteotomy  58 y.o. female returns for post-op check.  Doing well swelling is improving  Review of Systems: Negative except as noted in the HPI. Denies N/V/F/Ch.   Objective:  There were no vitals filed for this visit. There is no height or weight on file to calculate BMI. Constitutional Well developed. Well nourished.  Vascular Foot warm and well perfused. Capillary refill normal to all digits.  Calf is soft and supple, no posterior calf or knee pain, negative Homans' sign  Neurologic Normal speech. Oriented to person, place, and time. Epicritic sensation to light touch grossly present bilaterally.  Dermatologic Incision well-healed not hypertrophic  Orthopedic: Edema improved.  She has no pain to palpation noted about the surgical site.   Multiple view plain film radiographs: Correction is maintained.  She has near complete consolidation across the arthrodesis site and osteotomy Assessment:   1. Bunion, left foot    Plan:  Patient was evaluated and treated and all questions answered.  S/p foot surgery left -Doing well may gradually transition back to regular shoe gear and activity.  Return in 1 month to reevaluate with final x-rays. Return in about 5 weeks (around 09/01/2023).

## 2023-08-04 ENCOUNTER — Encounter: Payer: Self-pay | Admitting: Obstetrics and Gynecology

## 2023-08-04 ENCOUNTER — Ambulatory Visit (INDEPENDENT_AMBULATORY_CARE_PROVIDER_SITE_OTHER): Payer: Managed Care, Other (non HMO) | Admitting: Obstetrics and Gynecology

## 2023-08-04 VITALS — BP 148/70 | HR 95 | Temp 99.2°F | Ht 69.25 in | Wt 242.0 lb

## 2023-08-04 DIAGNOSIS — N393 Stress incontinence (female) (male): Secondary | ICD-10-CM

## 2023-08-04 DIAGNOSIS — Z1331 Encounter for screening for depression: Secondary | ICD-10-CM

## 2023-08-04 DIAGNOSIS — N898 Other specified noninflammatory disorders of vagina: Secondary | ICD-10-CM | POA: Diagnosis not present

## 2023-08-04 DIAGNOSIS — Z01419 Encounter for gynecological examination (general) (routine) without abnormal findings: Secondary | ICD-10-CM | POA: Insufficient documentation

## 2023-08-04 DIAGNOSIS — N958 Other specified menopausal and perimenopausal disorders: Secondary | ICD-10-CM | POA: Diagnosis not present

## 2023-08-04 NOTE — Assessment & Plan Note (Signed)
 Cervical cancer screening performed according to ASCCP guidelines. Encouraged annual mammogram screening Colonoscopy UTD DXA N/A Labs and immunizations with her primary Encouraged safe sexual practices as indicated Encouraged healthy lifestyle practices with diet and exercise For patients under 50-59yo, I recommend 1200mg  calcium daily and 600IU of vitamin D daily.

## 2023-08-04 NOTE — Progress Notes (Signed)
 58 y.o. G0P0 female with hx of LSIL(2016) here for annual exam. Single, long term relationship. Customer service at [employer].  Patient's last menstrual period was 04/25/2015.  She reports occasional urinary leakage at times with sneezing and vaginal itching occasionally. No abnormal discharge. No recent intercourse. Overall mild sx. Hx of ASCUS, HPV pos, last 2018, LSIL biopsy in 2016. HPV neg since 2019 Decided not to start HRT last year, has been doing well and would like to avoid HRT.  Abnormal bleeding: none Pelvic discharge or pain: none Breast mass, nipple discharge or skin changes : none Last PAP:     Component Value Date/Time   DIAGPAP (A) 07/01/2022 1600    - Atypical squamous cells of undetermined significance (ASC-US)   DIAGPAP  06/11/2021 1621    - Negative for intraepithelial lesion or malignancy (NILM)   HPVHIGH Negative 07/01/2022 1600   ADEQPAP  07/01/2022 1600    Satisfactory for evaluation; transformation zone component PRESENT.   ADEQPAP  06/11/2021 1621    Satisfactory for evaluation; transformation zone component PRESENT.   Last mammogram: 02/26/23 BIRADS 1, density a Last colonoscopy: 07/09/22, q69yr Sexually active: yes  Exercising: no Smoker: no  GYN HISTORY: LSIL, 2016  OB History  Gravida Para Term Preterm AB Living  0       SAB IAB Ectopic Multiple Live Births          Past Medical History:  Diagnosis Date   Allergy    seasonal   Anemia    in the past   Arthritis    ASCUS with positive high risk HPV 05/2014, 05/2015,05/2016   colposcopy biopsy 05/2014 showed LGSIL recommend repeat Pap smear one year   Asthma    Basal cell carcinoma    Bite from cat    secondary infection right hand sugery 10/08   Boil, thigh    recurrent boils of thighs- per pt Dr. Audie Box says from hormones   Dysrhythmia    palpitations (sporadic)   Fibromyalgia    Headache(784.0)    migraines   Hernia    Hypertension    Internal hemorrhoids    Serrated  adenoma of colon 2013    Past Surgical History:  Procedure Laterality Date   BUNIONECTOMY Left    Feb 2025   CHOLECYSTECTOMY  08/12/2011   Procedure: LAPAROSCOPIC CHOLECYSTECTOMY WITH INTRAOPERATIVE CHOLANGIOGRAM;  Surgeon: Ernestene Mention, MD;  Location: [facility];  Service: General;  Laterality: N/A;   COLONOSCOPY  04/02/2012   W/Dr.Stark   HAND SURGERY     Cat Bite trauma   HYSTEROSCOPY  08/2012   polyps   INCISIONAL HERNIA REPAIR  04/28/2012   Procedure: HERNIA REPAIR INCISIONAL;  Surgeon: Ernestene Mention, MD;  Location: [facility];  Service: General;  Laterality: N/A;   INSERTION OF MESH  04/28/2012   Procedure: INSERTION OF MESH;  Surgeon: Ernestene Mention, MD;  Location: [facility];  Service: General;  Laterality: N/A;   IUD REMOVAL N/A 08/13/2012   Procedure: INTRAUTERINE DEVICE (IUD) REMOVAL;  Surgeon: Dara Lords, MD;  Location: [facility];  Service: Gynecology;  Laterality: N/A;   PILONIDAL CYST / SINUS EXCISION     TONSILLECTOMY     WISDOM TOOTH EXTRACTION      Current Outpatient Medications on File Prior to Visit  Medication Sig Dispense Refill   BLACK COHOSH PO Take by mouth daily.     calcium-vitamin D (OSCAL WITH D) 500-200 MG-UNIT per tablet Take 1 tablet by mouth daily.  cetirizine (ZYRTEC) 10 MG tablet Take 2 tablets (20 mg total) by mouth 2 (two) times daily. 360 tablet 1   [employer] SUNSCREEN SPF 30 EX Apply 1 Application topically daily as needed.     cyclobenzaprine (FLEXERIL) 5 MG tablet Take 5-10 mg by mouth as needed.     Emollient (AVEENO ACTIVE NAT SKIN RELIEF EX) Apply 1 Application topically daily.     Multiple Vitamin (MULTIVITAMIN) capsule Take 1 capsule by mouth daily.     neomycin-polymyxin b-dexamethasone (MAXITROL) 3.5-10000-0.1 OINT SMARTSIG:sparingly In Eye(s) Twice Daily     Olopatadine-Mometasone (RYALTRIS) 665-25 MCG/ACT SUSP Place 2 sprays into the nose in the morning and at bedtime. 29 g 5   Turmeric (QC TUMERIC COMPLEX) 500 MG CAPS Take 500 mg  by mouth daily.     UNABLE TO FIND Med Name: brain health vitamin     No current facility-administered medications on file prior to visit.    Social History   Socioeconomic History   Marital status: Single    Spouse name: Not on file   Number of children: Not on file   Years of education: Not on file   Highest education level: Not on file  Occupational History   Not on file  Tobacco Use   Smoking status: Former    Current packs/day: 0.00    Types: Cigarettes    Quit date: 07/15/1998    Years since quitting: 25.0   Smokeless tobacco: Never  Vaping Use   Vaping status: Never Used  Substance and Sexual Activity   Alcohol use: Yes    Alcohol/week: 3.0 standard drinks of alcohol    Types: 3 Glasses of wine per week    Comment: Social   Drug use: Never   Sexual activity: Yes    Partners: Male    Birth control/protection: Post-menopausal    Comment: 1st intercourse 58 yo-More than 5 partners  Other Topics Concern   Not on file  Social History Narrative   Not on file   Social Drivers of Health   Financial Resource Strain: Not on file  Food Insecurity: Not on file  Transportation Needs: Not on file  Physical Activity: Not on file  Stress: Not on file  Social Connections: Not on file  Intimate Partner Violence: Not on file    Family History  Problem Relation Age of Onset   Asthma Mother    Colon polyps Mother    Stroke Mother        TIA   Heart disease Mother    Breast cancer Mother 81   Aneurysm Mother        brain   Colon polyps Father    Diabetes Father    Colon cancer Father 11   Breast cancer Maternal Grandmother        Age 29   Hypertension Paternal Grandmother    Colon cancer Paternal Grandfather    Crohn's disease Cousin    Esophageal cancer Neg Hx    Stomach cancer Neg Hx     Allergies  Allergen Reactions   Amoxicillin Hives   Penicillins Hives      PE Today's Vitals   08/04/23 1538  BP: (!) 148/70  Pulse: 95  Temp: 99.2 F (37.3 C)   TempSrc: Oral  SpO2: 97%  Weight: 242 lb (109.8 kg)  Height: 5' 9.25" (1.759 m)   Body mass index is 35.48 kg/m.  Physical Exam Vitals reviewed. Exam conducted with a chaperone present.  Constitutional:  General: She is not in acute distress.    Appearance: Normal appearance.  HENT:     Head: Normocephalic and atraumatic.     Nose: Nose normal.  Eyes:     Extraocular Movements: Extraocular movements intact.     Conjunctiva/sclera: Conjunctivae normal.  Neck:     Thyroid: No thyroid mass, thyromegaly or thyroid tenderness.  Pulmonary:     Effort: Pulmonary effort is normal.  Chest:     Chest wall: No mass or tenderness.  Breasts:    Right: Normal. No swelling, mass, nipple discharge, skin change or tenderness.     Left: Normal. No swelling, mass, nipple discharge, skin change or tenderness.  Abdominal:     General: There is no distension.     Palpations: Abdomen is soft.     Tenderness: There is no abdominal tenderness.  Genitourinary:    General: Normal vulva.     Exam position: Lithotomy position.     Urethra: No prolapse.     Vagina: Vaginal discharge present. No bleeding.     Cervix: Normal. No lesion.     Uterus: Normal. Not enlarged and not tender.      Adnexa: Right adnexa normal and left adnexa normal.  Musculoskeletal:        General: Normal range of motion.     Cervical back: Normal range of motion.  Lymphadenopathy:     Upper Body:     Right upper body: No axillary adenopathy.     Left upper body: No axillary adenopathy.     Lower Body: No right inguinal adenopathy. No left inguinal adenopathy.  Skin:    General: Skin is warm and dry.  Neurological:     General: No focal deficit present.     Mental Status: She is alert.  Psychiatric:        Mood and Affect: Mood normal.        Behavior: Behavior normal.      Assessment and Plan:        Well woman exam with routine gynecological exam Assessment & Plan: Cervical cancer screening performed  according to ASCCP guidelines. Encouraged annual mammogram screening Colonoscopy UTD DXA N/A Labs and immunizations with her primary Encouraged safe sexual practices as indicated Encouraged healthy lifestyle practices with diet and exercise For patients under 50-70yo, I recommend 1200mg  calcium daily and 600IU of vitamin D daily.    Vaginal discharge -     WET PREP FOR TRICH, YEAST, CLUE  Genitourinary syndrome of menopause Assessment & Plan: Considering applying aquaphor or coconut oil to the vulva after showers. You could also using daily vaginal moisturizers by brands like Good Clean Love and Ah! Yes.  Discussed vaginal estrogen, however patient declines at this time.   SUI (stress urinary incontinence, female)  Mild sx, encouraged kegels  Rosalyn Gess, MD

## 2023-08-04 NOTE — Patient Instructions (Signed)

## 2023-08-04 NOTE — Assessment & Plan Note (Signed)
 Considering applying aquaphor or coconut oil to the vulva after showers. You could also using daily vaginal moisturizers by brands like Good Clean Love and Ah! Yes.  Discussed vaginal estrogen, however patient declines at this time.

## 2023-08-05 ENCOUNTER — Encounter: Payer: Self-pay | Admitting: Obstetrics and Gynecology

## 2023-08-05 LAB — WET PREP FOR TRICH, YEAST, CLUE

## 2023-09-08 ENCOUNTER — Ambulatory Visit (INDEPENDENT_AMBULATORY_CARE_PROVIDER_SITE_OTHER)

## 2023-09-08 ENCOUNTER — Encounter: Payer: Self-pay | Admitting: Podiatry

## 2023-09-08 ENCOUNTER — Ambulatory Visit (INDEPENDENT_AMBULATORY_CARE_PROVIDER_SITE_OTHER): Admitting: Podiatry

## 2023-09-08 VITALS — Ht 69.25 in | Wt 242.0 lb

## 2023-09-08 DIAGNOSIS — M21612 Bunion of left foot: Secondary | ICD-10-CM

## 2023-09-08 NOTE — Progress Notes (Signed)
  Subjective:  Patient ID: April [name], female    DOB: [birth year],  MRN: [id number]  Chief Complaint  Patient presents with   Routine Post Op    Pt is here for routine post op visit 4 after surgery to left foot, pt states everything is going well, is having some stiffness in the foot, thinks it may be scar tissue.    DOS: 06/19/2023 Procedure: Left foot bunionectomy and second metatarsal osteotomy  58 y.o. female returns for post-op check.  Doing well still a little swelling but not bad, notes some stiffness in plantarflexion  Review of Systems: Negative except as noted in the HPI. Denies N/V/F/Ch.   Objective:  There were no vitals filed for this visit. Body mass index is 35.48 kg/m. Constitutional Well developed. Well nourished.  Vascular Foot warm and well perfused. Capillary refill normal to all digits.  Calf is soft and supple, no posterior calf or knee pain, negative Homans' sign  Neurologic Normal speech. Oriented to person, place, and time. Epicritic sensation to light touch grossly present bilaterally.  Dermatologic Incision well-healed not hypertrophic  Orthopedic: Minimal edema.  No pain to palpation.  Some limited plantarflexion of second MTP   Multiple view plain film radiographs: Correction is maintained.  No change in alignment, complete consolidation across fusion and osteotomy site Assessment:   1. Bunion, left foot    Plan:  Patient was evaluated and treated and all questions answered.  S/p foot surgery left - Overall doing well continue regular shoes and activity.  No restrictions at this point we discussed that scar tissue should continue to improve and she should continue range of motion exercises and scar massage daily.  Follow-up as needed No follow-ups on file.

## 2023-11-09 ENCOUNTER — Ambulatory Visit (INDEPENDENT_AMBULATORY_CARE_PROVIDER_SITE_OTHER): Admitting: Radiology

## 2023-11-09 ENCOUNTER — Encounter: Payer: Self-pay | Admitting: Radiology

## 2023-11-09 VITALS — BP 126/74 | HR 88 | Wt 221.0 lb

## 2023-11-09 DIAGNOSIS — N9489 Other specified conditions associated with female genital organs and menstrual cycle: Secondary | ICD-10-CM | POA: Diagnosis not present

## 2023-11-09 DIAGNOSIS — N898 Other specified noninflammatory disorders of vagina: Secondary | ICD-10-CM

## 2023-11-09 DIAGNOSIS — N951 Menopausal and female climacteric states: Secondary | ICD-10-CM

## 2023-11-09 DIAGNOSIS — N958 Other specified menopausal and perimenopausal disorders: Secondary | ICD-10-CM

## 2023-11-09 LAB — WET PREP FOR TRICH, YEAST, CLUE

## 2023-11-09 MED ORDER — FLUCONAZOLE 150 MG PO TABS: 150.0000 mg | ORAL_TABLET | ORAL | 0 refills | Status: AC

## 2023-11-09 MED ORDER — ESTRADIOL 0.1 MG/24HR TD PTTW: 1.0000 | MEDICATED_PATCH | TRANSDERMAL | 4 refills | Status: AC

## 2023-11-09 MED ORDER — PROGESTERONE 200 MG PO CAPS: 200.0000 mg | ORAL_CAPSULE | Freq: Every evening | ORAL | 4 refills | Status: AC

## 2023-11-09 MED ORDER — ESTRADIOL 0.1 MG/GM VA CREA: 1.0000 g | TOPICAL_CREAM | VAGINAL | 4 refills | Status: AC

## 2023-11-09 NOTE — Progress Notes (Signed)
      Subjective: April [name] is a 58 y.o. female who complains of vaginal/urinary burning, itching and irritation x 2 weeks. Started HRT from [facility], estradiol  2mg  oral, dhea, micronized progesterone  and estriol face cream, interested in getting Rx from us  instead.    Review of Systems  All other systems reviewed and are negative.   Past Medical History:  Diagnosis Date   Allergy     seasonal   Anemia    in the past   Arthritis    ASCUS with positive high risk HPV 05/2014, 05/2015,05/2016   colposcopy biopsy 05/2014 showed LGSIL recommend repeat Pap smear one year   Asthma    Basal cell carcinoma    Bite from cat    secondary infection right hand sugery 10/08   Boil, thigh    recurrent boils of thighs- per pt Dr. Rockney says from hormones   Dysrhythmia    palpitations (sporadic)   Fibromyalgia    Headache(784.0)    migraines   Hernia    Hypertension    Internal hemorrhoids    Serrated adenoma of colon 2013      Objective:  Today's Vitals   11/09/23 0900  BP: 126/74  Pulse: 88  SpO2: 96%  Weight: 221 lb (100.2 kg)   Body mass index is 32.4 kg/m.   Physical Exam Vitals and nursing note reviewed. Exam conducted with a chaperone present.  Constitutional:      Appearance: Normal appearance. She is well-developed.  Pulmonary:     Effort: Pulmonary effort is normal.  Abdominal:     General: Abdomen is flat.     Palpations: Abdomen is soft.  Genitourinary:    General: Normal vulva.     Vagina: Vaginal discharge present. No erythema, bleeding or lesions.     Cervix: Normal. No discharge, friability, lesion or erythema.     Uterus: Normal.      Adnexa: Right adnexa normal and left adnexa normal.     Comments: Moderately atrophic vaginal tissue  Neurological:     Mental Status: She is alert.   Psychiatric:        Mood and Affect: Mood normal.        Thought Content: Thought content normal.        Judgment: Judgment normal.      Microscopic wet-mount exam shows hyphae.   Urine dipstick shows positive for urobilinogen and positive for ketones.  Micro exam: 0-5 WBC's per HPF and few+ bacteria.   Darice Hoit, CMA present for exam  Assessment:/Plan:   1. Genitourinary syndrome of menopause (Primary) - estradiol  (ESTRACE  VAGINAL) 0.1 MG/GM vaginal cream; Place 1 g vaginally 3 (three) times a week.  Dispense: 42.5 g; Refill: 4  2. Vaginal burning +yeast Rx sent for fluconazole  - WET PREP FOR TRICH, YEAST, CLUE - Urinalysis,Complete w/RFL Culture  3. Menopausal symptoms Discussed risks and benefits of HRT. Will switch to patch to lessen cardiovascular risks.  - estradiol  (VIVELLE -DOT) 0.1 MG/24HR patch; Place 1 patch (0.1 mg total) onto the skin 2 (two) times a week.  Dispense: 24 patch; Refill: 4 - progesterone  (PROMETRIUM ) 200 MG capsule; Take 1 capsule (200 mg total) by mouth at bedtime.  Dispense: 180 capsule; Refill: 4      Tiane Szydlowski B, NP 9:19 AM

## 2023-11-11 ENCOUNTER — Ambulatory Visit: Payer: Self-pay | Admitting: Radiology

## 2023-11-11 LAB — URINALYSIS, COMPLETE W/RFL CULTURE
Glucose, UA: NEGATIVE
Hgb urine dipstick: NEGATIVE
Hyaline Cast: NONE SEEN /LPF
Leukocyte Esterase: NEGATIVE
Nitrites, Initial: NEGATIVE
Protein, ur: NEGATIVE
RBC / HPF: NONE SEEN /HPF (ref 0–2)
Specific Gravity, Urine: 1.01 (ref 1.001–1.035)
pH: 5.5 (ref 5.0–8.0)

## 2023-11-11 LAB — URINE CULTURE
MICRO NUMBER:: 16640424
SPECIMEN QUALITY:: ADEQUATE

## 2023-11-11 LAB — CULTURE INDICATED

## 2023-12-22 NOTE — Telephone Encounter (Signed)
 Decrease the patch to 0.05mg , continue progesterone 200mg  at bedtime

## 2024-01-20 NOTE — Telephone Encounter (Signed)
 That is only FDA approved for growth hormone insufficiency prescribed by endocrinology.

## 2024-02-29 ENCOUNTER — Ambulatory Visit (INDEPENDENT_AMBULATORY_CARE_PROVIDER_SITE_OTHER)

## 2024-02-29 ENCOUNTER — Ambulatory Visit: Admitting: Podiatry

## 2024-02-29 VITALS — Ht 69.25 in | Wt 221.0 lb

## 2024-02-29 DIAGNOSIS — T8484XA Pain due to internal orthopedic prosthetic devices, implants and grafts, initial encounter: Secondary | ICD-10-CM

## 2024-02-29 DIAGNOSIS — S99922A Unspecified injury of left foot, initial encounter: Secondary | ICD-10-CM

## 2024-02-29 NOTE — Progress Notes (Signed)
  Subjective:  April [name] ID: April April [name], female    DOB: Sep 16, 1965,  MRN: [id number]  Chief Complaint  April [name] presents with   Toe Injury    RM 21 EST PT 15 - Est Stumped second toe on left foot on 02/05/24. Toe has some minor swelling at distal end.    58 y.o. female presents with the above complaint. History confirmed with April [name].  Pain was quite severe for the first few weeks has started to improve and resolve from she wanted make sure that she did not have any complication of her previous surgical sites.  She also has a new lump over her previous scar that is occasionally tender.  Objective:  Physical Exam: warm, good capillary refill, no trophic changes or ulcerative lesions, normal DP and PT pulses, normal sensory exam, and left foot tenderness on second MTP joint worse plantar, no pain at the distal tip of the toe no bruising or ecchymosis mild edema she has a small cystlike development over the previous scar over the first TMT.   Radiographs: Multiple views x-ray of the left foot: no fracture, dislocation, swelling or degenerative changes noted and some prominence of screw at area of cyst Assessment:   1. Toe injury, left, initial encounter   2. Pain due to internal orthopedic prosthetic devices, implants and grafts, initial encounter      Plan:  April [name] was evaluated and treated and all questions answered.  Discussed he has a toe contusion likely strain of the plantar plate I recommended RICE use of NSAIDs and a Budin splint to alleviate pain and pressure on the toe.  This was dispensed.  Advised may take another month to heal completely if does not improve or worsens would recommend MRI.  Also has a cyst like area over the screw head at the first TMT we discussed that excision of this and removal of the hardware may alleviate this she will let me know if she would like to proceed with this.  No follow-ups on file.

## 2024-03-10 ENCOUNTER — Ambulatory Visit: Admitting: Podiatry

## 2024-03-14 ENCOUNTER — Other Ambulatory Visit: Payer: Self-pay | Admitting: Medical Genetics

## 2024-03-14 DIAGNOSIS — Z006 Encounter for examination for normal comparison and control in clinical research program: Secondary | ICD-10-CM

## 2024-04-19 LAB — GENECONNECT MOLECULAR SCREEN: Genetic Analysis Overall Interpretation: NEGATIVE

## 2024-05-31 ENCOUNTER — Ambulatory Visit (INDEPENDENT_AMBULATORY_CARE_PROVIDER_SITE_OTHER): Admitting: Podiatry

## 2024-05-31 DIAGNOSIS — L720 Epidermal cyst: Secondary | ICD-10-CM | POA: Diagnosis not present

## 2024-06-02 NOTE — Progress Notes (Signed)
"  °  Subjective:  Patient ID: April [name] Patient, female    DOB: May 01, 1966,  MRN: [id number]  Chief Complaint  Patient presents with   Cyst    RM 1 Patient is here for cyst of the left foot, very painful. Symptoms present for 1 week.    59 y.o. female presents with the above complaint. History confirmed with patient.  She returns for follow-up the cyst has returned and actually ruptured this week had thick waxy like material from it.  No pus or malodor  Objective:  Physical Exam: warm, good capillary refill, no trophic changes or ulcerative lesions, normal DP and PT pulses, normal sensory exam, and left foot tenderness on dorsal midfoot incision, there is a small cystic irruption no purulence malodor has drainage.  Thick waxy like material emanating from this   Radiographs: Multiple views x-ray of the left foot: no fracture, dislocation, swelling or degenerative changes noted and some prominence of screw at area of cyst Assessment:   1. Epidermal inclusion cyst      Plan:  Patient was evaluated and treated and all questions answered.  Presenting for follow-up the cyst has returned and has ruptured had a thick waxy like material emanating from this did not appear to be consistent with a suture abscess, I suspect likely she developed an epidermal inclusion cyst around the incision.  On evaluation the skin had already ruptured had waxy like material emanating from this I cultured some of this drainage, was able to evaluate and visualize a thick saclike structure around this.  I recommended evacuation of this and sending the specimen for pathology as well, local field block was performed 1.5 cc of 2% lidocaine  with epinephrine  and 0.5% Marcaine .  She tolerated this well.  The saclike structure was evacuated and removed, sent in formalin to pathology and I will notify her of the results.  Hopefully should be healed by secondary intention dressing was applied she should utilize antibiotic  ointment as well as a small bandage until it heals.  She will follow-up in 4 weeks to reevaluate the lesion as well as new radiographs.  There is no exposure of the screw heads so unsure of the screw is actually causing this.  Return in about 4 weeks (around 06/28/2024) for left cyst follow up ( new xrays) .    "

## 2024-06-08 ENCOUNTER — Ambulatory Visit: Payer: Self-pay | Admitting: Podiatry

## 2024-06-28 ENCOUNTER — Ambulatory Visit: Admitting: Podiatry

## 2024-08-09 ENCOUNTER — Ambulatory Visit: Admitting: Obstetrics and Gynecology
